# Patient Record
Sex: Male | Born: 1950 | Race: White | Hispanic: No | Marital: Married | Smoking: Former smoker
Health system: Southern US, Community
[De-identification: ages and names within clinical notes are randomized; demographics above are authoritative.]

## PROBLEM LIST (undated history)

## (undated) DIAGNOSIS — M1990 Unspecified osteoarthritis, unspecified site: Secondary | ICD-10-CM

## (undated) DIAGNOSIS — E785 Hyperlipidemia, unspecified: Secondary | ICD-10-CM

## (undated) DIAGNOSIS — A048 Other specified bacterial intestinal infections: Secondary | ICD-10-CM

## (undated) DIAGNOSIS — I10 Essential (primary) hypertension: Secondary | ICD-10-CM

## (undated) DIAGNOSIS — E538 Deficiency of other specified B group vitamins: Secondary | ICD-10-CM

## (undated) DIAGNOSIS — Z87442 Personal history of urinary calculi: Secondary | ICD-10-CM

## (undated) DIAGNOSIS — U071 COVID-19: Secondary | ICD-10-CM

## (undated) DIAGNOSIS — K219 Gastro-esophageal reflux disease without esophagitis: Secondary | ICD-10-CM

## (undated) DIAGNOSIS — E079 Disorder of thyroid, unspecified: Secondary | ICD-10-CM

## (undated) DIAGNOSIS — E0590 Thyrotoxicosis, unspecified without thyrotoxic crisis or storm: Secondary | ICD-10-CM

## (undated) DIAGNOSIS — E041 Nontoxic single thyroid nodule: Secondary | ICD-10-CM

## (undated) DIAGNOSIS — N400 Enlarged prostate without lower urinary tract symptoms: Secondary | ICD-10-CM

## (undated) DIAGNOSIS — K5792 Diverticulitis of intestine, part unspecified, without perforation or abscess without bleeding: Secondary | ICD-10-CM

## (undated) HISTORY — PX: THYROIDECTOMY, PARTIAL: SHX18

## (undated) HISTORY — PX: TONSILLECTOMY: SUR1361

## (undated) HISTORY — PX: REPLACEMENT TOTAL KNEE: SUR1224

## (undated) HISTORY — PX: HERNIA REPAIR: SHX51

## (undated) HISTORY — PX: CARPAL TUNNEL RELEASE: SHX101

## (undated) HISTORY — PX: HYDROCELE EXCISION: SHX482

## (undated) HISTORY — PX: COLONOSCOPY: SHX174

---

## 2007-03-09 ENCOUNTER — Ambulatory Visit: Payer: Self-pay | Admitting: Internal Medicine

## 2012-02-14 DIAGNOSIS — E782 Mixed hyperlipidemia: Secondary | ICD-10-CM | POA: Insufficient documentation

## 2012-02-14 DIAGNOSIS — I10 Essential (primary) hypertension: Secondary | ICD-10-CM | POA: Insufficient documentation

## 2012-09-25 DIAGNOSIS — E041 Nontoxic single thyroid nodule: Secondary | ICD-10-CM | POA: Insufficient documentation

## 2012-09-25 DIAGNOSIS — E890 Postprocedural hypothyroidism: Secondary | ICD-10-CM | POA: Insufficient documentation

## 2014-06-15 DIAGNOSIS — N200 Calculus of kidney: Secondary | ICD-10-CM | POA: Insufficient documentation

## 2014-09-01 DIAGNOSIS — A048 Other specified bacterial intestinal infections: Secondary | ICD-10-CM | POA: Insufficient documentation

## 2014-11-08 DIAGNOSIS — N401 Enlarged prostate with lower urinary tract symptoms: Secondary | ICD-10-CM | POA: Insufficient documentation

## 2017-08-11 DIAGNOSIS — E538 Deficiency of other specified B group vitamins: Secondary | ICD-10-CM | POA: Insufficient documentation

## 2019-08-09 DIAGNOSIS — D369 Benign neoplasm, unspecified site: Secondary | ICD-10-CM | POA: Insufficient documentation

## 2019-10-22 DIAGNOSIS — T8859XA Other complications of anesthesia, initial encounter: Secondary | ICD-10-CM

## 2019-10-22 HISTORY — DX: Other complications of anesthesia, initial encounter: T88.59XA

## 2020-01-19 DIAGNOSIS — Z96652 Presence of left artificial knee joint: Secondary | ICD-10-CM | POA: Insufficient documentation

## 2020-06-16 DIAGNOSIS — Z96652 Presence of left artificial knee joint: Secondary | ICD-10-CM | POA: Insufficient documentation

## 2020-10-12 DIAGNOSIS — Z8616 Personal history of COVID-19: Secondary | ICD-10-CM | POA: Insufficient documentation

## 2020-10-21 DIAGNOSIS — A419 Sepsis, unspecified organism: Secondary | ICD-10-CM

## 2020-10-21 HISTORY — DX: Sepsis, unspecified organism: A41.9

## 2021-08-16 ENCOUNTER — Other Ambulatory Visit: Payer: Self-pay | Admitting: Orthopedic Surgery

## 2021-08-16 DIAGNOSIS — G8929 Other chronic pain: Secondary | ICD-10-CM

## 2021-09-05 ENCOUNTER — Encounter
Admission: RE | Admit: 2021-09-05 | Discharge: 2021-09-05 | Disposition: A | Discharge status: Home / Self Care | Payer: Medicare Other | Source: Ambulatory Visit | Attending: Orthopedic Surgery | Admitting: Orthopedic Surgery

## 2021-09-05 ENCOUNTER — Other Ambulatory Visit: Payer: Self-pay

## 2021-09-05 DIAGNOSIS — G8929 Other chronic pain: Secondary | ICD-10-CM | POA: Insufficient documentation

## 2021-09-05 DIAGNOSIS — M25562 Pain in left knee: Secondary | ICD-10-CM | POA: Diagnosis present

## 2021-09-05 MED ORDER — TECHNETIUM TC 99M MEDRONATE IV KIT
20.0000 | PACK | Freq: Once | INTRAVENOUS | Status: AC | PRN
  Administered 2021-09-05: 22.26 via INTRAVENOUS

## 2021-09-06 ENCOUNTER — Ambulatory Visit: Payer: Medicare Other

## 2021-09-11 ENCOUNTER — Other Ambulatory Visit: Payer: Self-pay

## 2021-09-11 DIAGNOSIS — N400 Enlarged prostate without lower urinary tract symptoms: Secondary | ICD-10-CM | POA: Diagnosis present

## 2021-09-11 DIAGNOSIS — Z20822 Contact with and (suspected) exposure to covid-19: Secondary | ICD-10-CM | POA: Diagnosis present

## 2021-09-11 DIAGNOSIS — E871 Hypo-osmolality and hyponatremia: Secondary | ICD-10-CM | POA: Diagnosis present

## 2021-09-11 DIAGNOSIS — M5136 Other intervertebral disc degeneration, lumbar region: Secondary | ICD-10-CM | POA: Diagnosis present

## 2021-09-11 DIAGNOSIS — Z7952 Long term (current) use of systemic steroids: Secondary | ICD-10-CM

## 2021-09-11 DIAGNOSIS — E785 Hyperlipidemia, unspecified: Secondary | ICD-10-CM | POA: Diagnosis present

## 2021-09-11 DIAGNOSIS — I10 Essential (primary) hypertension: Secondary | ICD-10-CM | POA: Diagnosis present

## 2021-09-11 DIAGNOSIS — E876 Hypokalemia: Secondary | ICD-10-CM | POA: Diagnosis present

## 2021-09-11 DIAGNOSIS — N179 Acute kidney failure, unspecified: Secondary | ICD-10-CM | POA: Diagnosis present

## 2021-09-11 DIAGNOSIS — E872 Acidosis, unspecified: Secondary | ICD-10-CM | POA: Diagnosis present

## 2021-09-11 DIAGNOSIS — E039 Hypothyroidism, unspecified: Secondary | ICD-10-CM | POA: Diagnosis present

## 2021-09-11 DIAGNOSIS — D696 Thrombocytopenia, unspecified: Secondary | ICD-10-CM | POA: Diagnosis present

## 2021-09-11 DIAGNOSIS — Z96652 Presence of left artificial knee joint: Secondary | ICD-10-CM | POA: Diagnosis present

## 2021-09-11 DIAGNOSIS — A4159 Other Gram-negative sepsis: Principal | ICD-10-CM | POA: Diagnosis present

## 2021-09-11 DIAGNOSIS — Z791 Long term (current) use of non-steroidal anti-inflammatories (NSAID): Secondary | ICD-10-CM

## 2021-09-11 DIAGNOSIS — B961 Klebsiella pneumoniae [K. pneumoniae] as the cause of diseases classified elsewhere: Secondary | ICD-10-CM | POA: Diagnosis present

## 2021-09-11 DIAGNOSIS — R0602 Shortness of breath: Secondary | ICD-10-CM | POA: Diagnosis not present

## 2021-09-11 DIAGNOSIS — N390 Urinary tract infection, site not specified: Secondary | ICD-10-CM | POA: Diagnosis present

## 2021-09-11 DIAGNOSIS — R0789 Other chest pain: Secondary | ICD-10-CM | POA: Diagnosis present

## 2021-09-11 DIAGNOSIS — Z79899 Other long term (current) drug therapy: Secondary | ICD-10-CM

## 2021-09-11 NOTE — ED Triage Notes (Signed)
Pt to ED from home c/o fever of 100.4 at home, body aches, chills, chest tightness, and SOB for a day.  Denies cough, denies n/v/d, denies urinary symptoms.  Pt states felt like shaking and couldn't get warm.  Pt anxious and pale in triage, rubbing chest.

## 2021-09-12 ENCOUNTER — Inpatient Hospital Stay
Admission: EM | Admit: 2021-09-12 | Discharge: 2021-09-15 | DRG: 872 | Disposition: A | Discharge status: Home Health | Payer: Medicare Other | Attending: Internal Medicine | Admitting: Internal Medicine

## 2021-09-12 ENCOUNTER — Encounter: Payer: Self-pay | Admitting: Emergency Medicine

## 2021-09-12 ENCOUNTER — Emergency Department: Payer: Medicare Other

## 2021-09-12 DIAGNOSIS — E785 Hyperlipidemia, unspecified: Secondary | ICD-10-CM | POA: Diagnosis present

## 2021-09-12 DIAGNOSIS — N179 Acute kidney failure, unspecified: Secondary | ICD-10-CM | POA: Diagnosis present

## 2021-09-12 DIAGNOSIS — Z791 Long term (current) use of non-steroidal anti-inflammatories (NSAID): Secondary | ICD-10-CM | POA: Diagnosis not present

## 2021-09-12 DIAGNOSIS — R079 Chest pain, unspecified: Secondary | ICD-10-CM | POA: Diagnosis not present

## 2021-09-12 DIAGNOSIS — R0602 Shortness of breath: Secondary | ICD-10-CM | POA: Diagnosis present

## 2021-09-12 DIAGNOSIS — N390 Urinary tract infection, site not specified: Secondary | ICD-10-CM | POA: Diagnosis present

## 2021-09-12 DIAGNOSIS — B9689 Other specified bacterial agents as the cause of diseases classified elsewhere: Secondary | ICD-10-CM | POA: Insufficient documentation

## 2021-09-12 DIAGNOSIS — D696 Thrombocytopenia, unspecified: Secondary | ICD-10-CM | POA: Diagnosis present

## 2021-09-12 DIAGNOSIS — Z96652 Presence of left artificial knee joint: Secondary | ICD-10-CM | POA: Diagnosis present

## 2021-09-12 DIAGNOSIS — R7881 Bacteremia: Secondary | ICD-10-CM | POA: Diagnosis not present

## 2021-09-12 DIAGNOSIS — M5136 Other intervertebral disc degeneration, lumbar region: Secondary | ICD-10-CM | POA: Diagnosis present

## 2021-09-12 DIAGNOSIS — A4150 Gram-negative sepsis, unspecified: Secondary | ICD-10-CM | POA: Diagnosis not present

## 2021-09-12 DIAGNOSIS — E871 Hypo-osmolality and hyponatremia: Secondary | ICD-10-CM | POA: Diagnosis present

## 2021-09-12 DIAGNOSIS — E039 Hypothyroidism, unspecified: Secondary | ICD-10-CM | POA: Diagnosis present

## 2021-09-12 DIAGNOSIS — R0789 Other chest pain: Secondary | ICD-10-CM | POA: Diagnosis present

## 2021-09-12 DIAGNOSIS — Z20822 Contact with and (suspected) exposure to covid-19: Secondary | ICD-10-CM | POA: Diagnosis present

## 2021-09-12 DIAGNOSIS — A498 Other bacterial infections of unspecified site: Secondary | ICD-10-CM | POA: Diagnosis not present

## 2021-09-12 DIAGNOSIS — A4159 Other Gram-negative sepsis: Secondary | ICD-10-CM | POA: Diagnosis present

## 2021-09-12 DIAGNOSIS — Z7952 Long term (current) use of systemic steroids: Secondary | ICD-10-CM | POA: Diagnosis not present

## 2021-09-12 DIAGNOSIS — E872 Acidosis, unspecified: Secondary | ICD-10-CM | POA: Diagnosis present

## 2021-09-12 DIAGNOSIS — B961 Klebsiella pneumoniae [K. pneumoniae] as the cause of diseases classified elsewhere: Secondary | ICD-10-CM | POA: Diagnosis present

## 2021-09-12 DIAGNOSIS — N400 Enlarged prostate without lower urinary tract symptoms: Secondary | ICD-10-CM | POA: Diagnosis present

## 2021-09-12 DIAGNOSIS — Z1612 Extended spectrum beta lactamase (ESBL) resistance: Secondary | ICD-10-CM | POA: Diagnosis not present

## 2021-09-12 DIAGNOSIS — E876 Hypokalemia: Secondary | ICD-10-CM | POA: Diagnosis present

## 2021-09-12 DIAGNOSIS — I10 Essential (primary) hypertension: Secondary | ICD-10-CM | POA: Diagnosis present

## 2021-09-12 DIAGNOSIS — R000 Tachycardia, unspecified: Secondary | ICD-10-CM

## 2021-09-12 DIAGNOSIS — Z79899 Other long term (current) drug therapy: Secondary | ICD-10-CM | POA: Diagnosis not present

## 2021-09-12 DIAGNOSIS — A419 Sepsis, unspecified organism: Secondary | ICD-10-CM

## 2021-09-12 HISTORY — DX: Disorder of thyroid, unspecified: E07.9

## 2021-09-12 HISTORY — DX: Essential (primary) hypertension: I10

## 2021-09-12 HISTORY — DX: Hyperlipidemia, unspecified: E78.5

## 2021-09-12 LAB — TROPONIN I (HIGH SENSITIVITY)
Troponin I (High Sensitivity): 12 ng/L (ref <18)
Troponin I (High Sensitivity): 16 ng/L (ref <18)
Troponin I (High Sensitivity): 10 ng/L (ref <18)
Troponin I (High Sensitivity): 8 ng/L (ref <18)

## 2021-09-12 LAB — COMPREHENSIVE METABOLIC PANEL
ALT: 32 U/L (ref 0–44)
AST: 25 U/L (ref 15–41)
Albumin: 3.6 g/dL (ref 3.5–5.0)
Alkaline Phosphatase: 77 U/L (ref 38–126)
Anion gap: 9 (ref 5–15)
BUN: 22 mg/dL (ref 8–23)
CO2: 20 mmol/L — ABNORMAL LOW (ref 22–32)
Calcium: 8.6 mg/dL — ABNORMAL LOW (ref 8.9–10.3)
Chloride: 102 mmol/L (ref 98–111)
Creatinine, Ser: 1.31 mg/dL — ABNORMAL HIGH (ref 0.61–1.24)
GFR, Estimated: 59 mL/min — ABNORMAL LOW (ref >60)
Glucose, Bld: 121 mg/dL — ABNORMAL HIGH (ref 70–99)
Potassium: 3.5 mmol/L (ref 3.5–5.1)
Sodium: 131 mmol/L — ABNORMAL LOW (ref 135–145)
Total Bilirubin: 1.1 mg/dL (ref 0.3–1.2)
Total Protein: 7.1 g/dL (ref 6.5–8.1)

## 2021-09-12 LAB — PROCALCITONIN: Procalcitonin: 0.34 ng/mL

## 2021-09-12 LAB — CBC WITH DIFFERENTIAL/PLATELET
Abs Immature Granulocytes: 0.04 10*3/uL (ref 0.00–0.07)
Basophils Absolute: 0 10*3/uL (ref 0.0–0.1)
Basophils Relative: 0 %
Eosinophils Absolute: 0 10*3/uL (ref 0.0–0.5)
Eosinophils Relative: 0 %
HCT: 40 % (ref 39.0–52.0)
Hemoglobin: 14.3 g/dL (ref 13.0–17.0)
Immature Granulocytes: 1 %
Lymphocytes Relative: 12 %
Lymphs Abs: 0.8 10*3/uL (ref 0.7–4.0)
MCH: 31 pg (ref 26.0–34.0)
MCHC: 35.8 g/dL (ref 30.0–36.0)
MCV: 86.6 fL (ref 80.0–100.0)
Monocytes Absolute: 0.1 10*3/uL (ref 0.1–1.0)
Monocytes Relative: 1 %
Neutro Abs: 5.4 10*3/uL (ref 1.7–7.7)
Neutrophils Relative %: 86 %
Platelets: 155 10*3/uL (ref 150–400)
RBC: 4.62 MIL/uL (ref 4.22–5.81)
RDW: 12.2 % (ref 11.5–15.5)
WBC: 6.3 10*3/uL (ref 4.0–10.5)
nRBC: 0 % (ref 0.0–0.2)

## 2021-09-12 LAB — RESP PANEL BY RT-PCR (FLU A&B, COVID) ARPGX2
Influenza A by PCR: NEGATIVE
Influenza B by PCR: NEGATIVE
SARS Coronavirus 2 by RT PCR: NEGATIVE

## 2021-09-12 LAB — BLOOD CULTURE ID PANEL (REFLEXED) - BCID2

## 2021-09-12 LAB — URINALYSIS, ROUTINE W REFLEX MICROSCOPIC
Bilirubin Urine: NEGATIVE
Glucose, UA: NEGATIVE mg/dL
Ketones, ur: NEGATIVE mg/dL
Nitrite: NEGATIVE
Protein, ur: NEGATIVE mg/dL
Specific Gravity, Urine: 1.009 (ref 1.005–1.030)
pH: 5 (ref 5.0–8.0)

## 2021-09-12 LAB — LACTIC ACID, PLASMA
Lactic Acid, Venous: 2.9 mmol/L (ref 0.5–1.9)
Lactic Acid, Venous: 2.1 mmol/L (ref 0.5–1.9)
Lactic Acid, Venous: 1.8 mmol/L (ref 0.5–1.9)
Lactic Acid, Venous: 1.9 mmol/L (ref 0.5–1.9)

## 2021-09-12 LAB — HIV ANTIBODY (ROUTINE TESTING W REFLEX): HIV Screen 4th Generation wRfx: NONREACTIVE

## 2021-09-12 LAB — T4, FREE: Free T4: 0.77 ng/dL (ref 0.61–1.12)

## 2021-09-12 LAB — TSH: TSH: 0.989 u[IU]/mL (ref 0.350–4.500)

## 2021-09-12 LAB — LIPASE, BLOOD: Lipase: 53 U/L — ABNORMAL HIGH (ref 11–51)

## 2021-09-12 LAB — BRAIN NATRIURETIC PEPTIDE: B Natriuretic Peptide: 17.6 pg/mL (ref 0.0–100.0)

## 2021-09-12 MED ORDER — SODIUM CHLORIDE 0.9 % IV BOLUS (SEPSIS)
250.0000 mL | Freq: Once | INTRAVENOUS | Status: AC
  Administered 2021-09-12: 250 mL via INTRAVENOUS

## 2021-09-12 MED ORDER — ACETAMINOPHEN 650 MG RE SUPP
650.0000 mg | Freq: Four times a day (QID) | RECTAL | Status: DC | PRN
  Filled 2021-09-12: qty 1

## 2021-09-12 MED ORDER — ACETAMINOPHEN 325 MG PO TABS
650.0000 mg | ORAL_TABLET | Freq: Four times a day (QID) | ORAL | Status: DC | PRN
  Administered 2021-09-12 – 2021-09-15 (×6): 650 mg via ORAL
  Filled 2021-09-12 (×6): qty 2

## 2021-09-12 MED ORDER — VANCOMYCIN HCL 1500 MG/300ML IV SOLN
1500.0000 mg | Freq: Once | INTRAVENOUS | Status: AC
  Administered 2021-09-12: 1500 mg via INTRAVENOUS
  Filled 2021-09-12: qty 300

## 2021-09-12 MED ORDER — TRAZODONE HCL 50 MG PO TABS
25.0000 mg | ORAL_TABLET | Freq: Every evening | ORAL | Status: DC | PRN
  Administered 2021-09-12: 25 mg via ORAL
  Filled 2021-09-12: qty 1

## 2021-09-12 MED ORDER — ROSUVASTATIN CALCIUM 10 MG PO TABS
20.0000 mg | ORAL_TABLET | Freq: Every day | ORAL | Status: DC
  Administered 2021-09-13 – 2021-09-14 (×2): 20 mg via ORAL
  Filled 2021-09-12: qty 2
  Filled 2021-09-12 (×2): qty 1

## 2021-09-12 MED ORDER — MORPHINE SULFATE (PF) 2 MG/ML IV SOLN
2.0000 mg | INTRAVENOUS | Status: DC | PRN
  Administered 2021-09-12: 2 mg via INTRAVENOUS
  Filled 2021-09-12: qty 1

## 2021-09-12 MED ORDER — FINASTERIDE 5 MG PO TABS
5.0000 mg | ORAL_TABLET | Freq: Every day | ORAL | Status: DC
  Administered 2021-09-12 – 2021-09-15 (×4): 5 mg via ORAL
  Filled 2021-09-12 (×5): qty 1

## 2021-09-12 MED ORDER — DIAZEPAM 5 MG PO TABS
5.0000 mg | ORAL_TABLET | Freq: Every evening | ORAL | Status: DC | PRN
  Administered 2021-09-13 – 2021-09-14 (×2): 5 mg via ORAL
  Filled 2021-09-12 (×2): qty 1

## 2021-09-12 MED ORDER — ENOXAPARIN SODIUM 40 MG/0.4ML IJ SOSY
40.0000 mg | PREFILLED_SYRINGE | INTRAMUSCULAR | Status: DC
  Administered 2021-09-12 – 2021-09-14 (×3): 40 mg via SUBCUTANEOUS
  Filled 2021-09-12 (×3): qty 0.4

## 2021-09-12 MED ORDER — ONDANSETRON HCL 4 MG/2ML IJ SOLN: 4.0000 mg | Freq: Four times a day (QID) | INTRAMUSCULAR | Status: DC | PRN

## 2021-09-12 MED ORDER — TAMSULOSIN HCL 0.4 MG PO CAPS
0.4000 mg | ORAL_CAPSULE | Freq: Every day | ORAL | Status: DC
  Administered 2021-09-12 – 2021-09-15 (×4): 0.4 mg via ORAL
  Filled 2021-09-12 (×4): qty 1

## 2021-09-12 MED ORDER — MAGNESIUM HYDROXIDE 400 MG/5ML PO SUSP: 30.0000 mL | Freq: Every day | ORAL | Status: DC | PRN

## 2021-09-12 MED ORDER — SODIUM CHLORIDE 0.9 % IV SOLN
1.0000 g | Freq: Two times a day (BID) | INTRAVENOUS | Status: DC
  Administered 2021-09-12 – 2021-09-13 (×3): 1 g via INTRAVENOUS
  Filled 2021-09-12 (×3): qty 1

## 2021-09-12 MED ORDER — METRONIDAZOLE 500 MG/100ML IV SOLN
500.0000 mg | Freq: Once | INTRAVENOUS | Status: AC
  Administered 2021-09-12: 500 mg via INTRAVENOUS
  Filled 2021-09-12: qty 100

## 2021-09-12 MED ORDER — IOHEXOL 350 MG/ML SOLN
75.0000 mL | Freq: Once | INTRAVENOUS | Status: AC | PRN
  Administered 2021-09-12: 75 mL via INTRAVENOUS

## 2021-09-12 MED ORDER — SODIUM CHLORIDE 0.9 % IV BOLUS
1000.0000 mL | Freq: Once | INTRAVENOUS | Status: AC
  Administered 2021-09-12: 1000 mL via INTRAVENOUS

## 2021-09-12 MED ORDER — SODIUM CHLORIDE 0.9 % IV BOLUS (SEPSIS)
1000.0000 mL | Freq: Once | INTRAVENOUS | Status: AC
  Administered 2021-09-12: 1000 mL via INTRAVENOUS

## 2021-09-12 MED ORDER — ATENOLOL 50 MG PO TABS
50.0000 mg | ORAL_TABLET | Freq: Every day | ORAL | Status: DC
  Administered 2021-09-13 – 2021-09-15 (×3): 50 mg via ORAL
  Filled 2021-09-12: qty 2
  Filled 2021-09-12: qty 1
  Filled 2021-09-12: qty 2
  Filled 2021-09-12: qty 1

## 2021-09-12 MED ORDER — VANCOMYCIN HCL IN DEXTROSE 1-5 GM/200ML-% IV SOLN: 1000.0000 mg | Freq: Once | INTRAVENOUS | Status: DC

## 2021-09-12 MED ORDER — SODIUM CHLORIDE 0.9 % IV SOLN: INTRAVENOUS | Status: DC

## 2021-09-12 MED ORDER — ASPIRIN EC 81 MG PO TBEC
81.0000 mg | DELAYED_RELEASE_TABLET | Freq: Every day | ORAL | Status: DC
  Administered 2021-09-12 – 2021-09-15 (×4): 81 mg via ORAL
  Filled 2021-09-12 (×4): qty 1

## 2021-09-12 MED ORDER — SODIUM CHLORIDE 0.9 % IV SOLN
2.0000 g | Freq: Once | INTRAVENOUS | Status: AC
  Administered 2021-09-12: 2 g via INTRAVENOUS
  Filled 2021-09-12 (×2): qty 2

## 2021-09-12 MED ORDER — GABAPENTIN 300 MG PO CAPS
300.0000 mg | ORAL_CAPSULE | Freq: Every day | ORAL | Status: DC
  Administered 2021-09-12 – 2021-09-15 (×4): 300 mg via ORAL
  Filled 2021-09-12 (×4): qty 1

## 2021-09-12 MED ORDER — NITROGLYCERIN 0.4 MG SL SUBL: 0.4000 mg | SUBLINGUAL_TABLET | SUBLINGUAL | Status: DC | PRN

## 2021-09-12 MED ORDER — ONDANSETRON HCL 4 MG PO TABS: 4.0000 mg | ORAL_TABLET | Freq: Four times a day (QID) | ORAL | Status: DC | PRN

## 2021-09-12 MED ORDER — SODIUM CHLORIDE 0.9 % IV BOLUS (SEPSIS): 1000.0000 mL | Freq: Once | INTRAVENOUS | Status: DC

## 2021-09-12 MED ORDER — SODIUM CHLORIDE 0.9 % IV SOLN
2.0000 g | INTRAVENOUS | Status: DC
  Administered 2021-09-12: 2 g via INTRAVENOUS
  Filled 2021-09-12: qty 20

## 2021-09-12 NOTE — Progress Notes (Signed)
PHARMACY - PHYSICIAN COMMUNICATION CRITICAL VALUE ALERT - BLOOD CULTURE IDENTIFICATION (BCID)  Ernest Hutchinson is an 70 y.o. male who presented to [facility] on 09/12/2021 with a chief complaint of Shortness of breath  Assessment:  Bacteremia likely  from complicated UTI (include suspected source if known)  Name of physician (or Provider) Contacted: Eppie Gibson  Current antibiotics: Ceftriaxone 2gm IVPB q 24 hours  Changes to prescribed antibiotics recommended:  Recommendations accepted by provider  *DC ceftriaxone and start Meropenem 1gm q 12 hours (renally adjusted)  Results for orders placed or performed during the hospital encounter of 09/12/21  Blood Culture ID Panel (Reflexed) (Collected: 09/12/2021 12:19 AM)  Result Value Ref Range   Enterococcus faecalis NOT DETECTED NOT DETECTED   Enterococcus Faecium NOT DETECTED NOT DETECTED   Listeria monocytogenes NOT DETECTED NOT DETECTED   Staphylococcus species NOT DETECTED NOT DETECTED   Staphylococcus aureus (BCID) NOT DETECTED NOT DETECTED   Staphylococcus epidermidis NOT DETECTED NOT DETECTED   Staphylococcus lugdunensis NOT DETECTED NOT DETECTED   Streptococcus species NOT DETECTED NOT DETECTED   Streptococcus agalactiae NOT DETECTED NOT DETECTED   Streptococcus pneumoniae NOT DETECTED NOT DETECTED   Streptococcus pyogenes NOT DETECTED NOT DETECTED   A.calcoaceticus-baumannii NOT DETECTED NOT DETECTED   Bacteroides fragilis NOT DETECTED NOT DETECTED   Enterobacterales DETECTED (A) NOT DETECTED   Enterobacter cloacae complex NOT DETECTED NOT DETECTED   Escherichia coli NOT DETECTED NOT DETECTED   Klebsiella aerogenes NOT DETECTED NOT DETECTED   Klebsiella oxytoca NOT DETECTED NOT DETECTED   Klebsiella pneumoniae DETECTED (A) NOT DETECTED   Proteus species NOT DETECTED NOT DETECTED   Salmonella species NOT DETECTED NOT DETECTED   Serratia marcescens NOT DETECTED NOT DETECTED   Haemophilus influenzae NOT DETECTED NOT  DETECTED   Neisseria meningitidis NOT DETECTED NOT DETECTED   Pseudomonas aeruginosa NOT DETECTED NOT DETECTED   Stenotrophomonas maltophilia NOT DETECTED NOT DETECTED   Candida albicans NOT DETECTED NOT DETECTED   Candida auris NOT DETECTED NOT DETECTED   Candida glabrata NOT DETECTED NOT DETECTED   Candida krusei NOT DETECTED NOT DETECTED   Candida parapsilosis NOT DETECTED NOT DETECTED   Candida tropicalis NOT DETECTED NOT DETECTED   Cryptococcus neoformans/gattii NOT DETECTED NOT DETECTED   CTX-M ESBL DETECTED (A) NOT DETECTED   Carbapenem resistance IMP NOT DETECTED NOT DETECTED   Carbapenem resistance KPC NOT DETECTED NOT DETECTED   Carbapenem resistance NDM NOT DETECTED NOT DETECTED   Carbapenem resist OXA 48 LIKE NOT DETECTED NOT DETECTED   Carbapenem resistance VIM NOT DETECTED NOT DETECTED    Nechuma Boven Rodriguez-Guzman PharmD, BCPS 09/12/2021 1:57 PM

## 2021-09-12 NOTE — Progress Notes (Signed)
PHARMACY -  BRIEF ANTIBIOTIC NOTE   Pharmacy has received consult(s) for Vanc, cefepime  from an ED provider.  The patient's profile has been reviewed for ht/wt/allergies/indication/available labs.    One time order(s) placed for Vancomycin 1500 mg IV X 1 and Cefepime 2 gm IV X 1   Further antibiotics/pharmacy consults should be ordered by admitting physician if indicated.                       Thank you, Tommye Lehenbauer D 09/12/2021  1:25 AM

## 2021-09-12 NOTE — Progress Notes (Signed)
CODE SEPSIS - PHARMACY COMMUNICATION  **Broad Spectrum Antibiotics should be administered within 1 hour of Sepsis diagnosis**  Time Code Sepsis Called/Page Received: 11/23 @ 0114  Antibiotics Ordered: Vanc, cefepime, metronidazole  Time of 1st antibiotic administration: Metronidazole 500 mg IV  on 11/23 @ 0136  Additional action taken by pharmacy:   If necessary, Name of Provider/Nurse Contacted:     Jax Abdelrahman D ,PharmD Clinical Pharmacist  09/12/2021  1:47 AM

## 2021-09-12 NOTE — Progress Notes (Addendum)
PROGRESS NOTE    Ernest Hutchinson  URK:[id number] DOB: April 27, 1951 DOA: 09/12/2021 PCP: Rusty Aus, MD    Assessment & Plan:   Principal Problem:   UTI (urinary tract infection) Sepsis: met criteria w/ tachycardia, tachypnea, elevated lactic acid & UTI & bacteremia. Abxs were changed to IV merrem. Urine cx is pending  UTI: continue on IV merrem. Urine cx is pending.  Bacteremia: blood cxs growing klebsiella pneumoniae, sens pending. Likely secondary to UTI. Continue on IV merrem   Hyponatremia: continue on IVFs. Will continue to monitor   Lactic acidosis: resolved   Chest pain: w/ associated dyspnea. Troponins neg x 4. Cardio consulted. Nitro, morphine prn.  AKI: continue to hold zestril. Continue on IVFs   HLD: continue on statin   BPH: continue on home dose of flomax, proscar   HTN: continue on atenolol. Continue to hold zestril secondary to AKI    DVT prophylaxis: lovenox  Code Status: full  Family Communication: discussed pt's care w/ pt's wife at bedside and answered her questions  Disposition Plan: likely d/c back home   Level of care: Telemetry Cardiac  Status is: Inpatient  Remains inpatient appropriate because: severity of illness, requiring IV abxs for bacteremia & UTI      Consultants:  Cardio   Procedures:   Antimicrobials: merrem   Subjective: Pt c/o fatigue   Objective: Vitals:   09/12/21 1515 09/12/21 1530 09/12/21 1540 09/12/21 1545  BP:   122/62   Pulse: 80 83 80 85  Resp: _0 (!) 21  Temp:      TempSrc:      SpO2: 100% 100% 100% 100%  Weight:      Height:        Intake/Output Summary (Last 24 hours) at 09/12/2021 1655 Last data filed at 09/12/2021 1115 Gross per 24 hour  Intake 2650 ml  Output --  Net 2650 ml   Filed Weights   09/11/21 2358  Weight: 68 kg    Examination:  General exam: Appears calm and comfortable  Respiratory system: Clear to auscultation. Respiratory effort normal. Cardiovascular  system: S1 & S2 +. No rubs, gallops or clicks.  Gastrointestinal system: Abdomen is nondistended, soft and nontender. Normal bowel sounds heard. Central nervous system: Alert and oriented. Moves all extremities  Psychiatry: Judgement and insight appear normal. Flat mood and affect     Data Reviewed: I have personally reviewed following labs and imaging studies  CBC: Recent Labs  Lab 09/12/21 0019  WBC 6.3  NEUTROABS 5.4  HGB 14.3  HCT 40.0  MCV 86.6  PLT 831   Basic Metabolic Panel: Recent Labs  Lab 09/12/21 0019  NA 131*  K 3.5  CL 102  CO2 20*  GLUCOSE 121*  BUN 22  CREATININE 1.31*  CALCIUM 8.6*   GFR: Estimated Creatinine Clearance: 48.7 mL/min (A) (by C-G formula based on SCr of 1.31 mg/dL (H)). Liver Function Tests: Recent Labs  Lab 09/12/21 0019  AST 25  ALT 32  ALKPHOS 77  BILITOT 1.1  PROT 7.1  ALBUMIN 3.6   Recent Labs  Lab 09/12/21 0019  LIPASE 53*   No results for input(s): AMMONIA in the last 168 hours. Coagulation Profile: No results for input(s): INR, PROTIME in the last 168 hours. Cardiac Enzymes: No results for input(s): CKTOTAL, CKMB, CKMBINDEX, TROPONINI in the last 168 hours. BNP (last 3 results) No results for input(s): PROBNP in the last 8760 hours. HbA1C: No results for input(s): HGBA1C in the  last 72 hours. CBG: No results for input(s): GLUCAP in the last 168 hours. Lipid Profile: No results for input(s): CHOL, HDL, LDLCALC, TRIG, CHOLHDL, LDLDIRECT in the last 72 hours. Thyroid Function Tests: Recent Labs    09/12/21 0019  TSH 0.989  FREET4 0.77   Anemia Panel: No results for input(s): VITAMINB12, FOLATE, FERRITIN, TIBC, IRON, RETICCTPCT in the last 72 hours. Sepsis Labs: Recent Labs  Lab 09/12/21 0019 09/12/21 1145 09/12/21 1500  PROCALCITON 0.34  --   --   LATICACIDVEN 2.1*  2.9* 1.8 1.9    Recent Results (from the past 240 hour(s))  Culture, blood (routine x 2)     Status: None (Preliminary result)    Collection Time: 09/12/21 12:19 AM   Specimen: BLOOD RIGHT FOREARM  Result Value Ref Range Status   Specimen Description BLOOD RIGHT FOREARM  Final   Special Requests   Final    BOTTLES DRAWN AEROBIC AND ANAEROBIC Blood Culture adequate volume   Culture  Setup Time   Final    Organism ID to follow GRAM NEGATIVE RODS IN BOTH AEROBIC AND ANAEROBIC BOTTLES CRITICAL RESULT CALLED TO, READ BACK BY AND VERIFIED WITH: KISHAN PATEL @ [id number] ON 09/12/21.Marland KitchenMarland KitchenTKR Performed at [facility], [address] [place], [place] [address]    Culture GRAM NEGATIVE RODS  Final   Report Status PENDING  Incomplete  Culture, blood (routine x 2)     Status: None (Preliminary result)   Collection Time: 09/12/21 12:19 AM   Specimen: BLOOD  Result Value Ref Range Status   Specimen Description BLOOD LEFT HAND  Final   Special Requests   Final    BOTTLES DRAWN AEROBIC AND ANAEROBIC Blood Culture results may not be optimal due to an inadequate volume of blood received in culture bottles   Culture   Final    NO GROWTH < 12 HOURS Performed at [facility], [address], [place], [place] [address]    Report Status PENDING  Incomplete  Resp Panel by RT-PCR (Flu A&B, Covid) Nasopharyngeal Swab     Status: None   Collection Time: 09/12/21 12:19 AM   Specimen: Nasopharyngeal Swab; Nasopharyngeal(NP) swabs in vial transport medium  Result Value Ref Range Status   SARS Coronavirus 2 by RT PCR NEGATIVE NEGATIVE Final    Comment: (NOTE) SARS-CoV-2 target nucleic acids are NOT DETECTED.  The SARS-CoV-2 RNA is generally detectable in upper respiratory specimens during the acute phase of infection. The lowest concentration of SARS-CoV-2 viral copies this assay can detect is 138 copies/mL. A negative result does not preclude SARS-Cov-2 infection and should not be used as the sole basis for treatment or other patient management decisions. A negative result may occur with  improper specimen  collection/handling, submission of specimen other than nasopharyngeal swab, presence of viral mutation(s) within the areas targeted by this assay, and inadequate number of viral copies(<138 copies/mL). A negative result must be combined with clinical observations, patient history, and epidemiological information. The expected result is Negative.  Fact Sheet for Patients:  EntrepreneurPulse.com.au  Fact Sheet for Healthcare Providers:  IncredibleEmployment.be  This test is no t yet approved or cleared by the [place] FDA and  has been authorized for detection and/or diagnosis of SARS-CoV-2 by FDA under an Emergency Use Authorization (EUA). This EUA will remain  in effect (meaning this test can be used) for the duration of the COVID-19 declaration under Section 564(b)(1) of the Act, 21 U.S.C.section 360bbb-3(b)(1), unless the authorization is terminated  or  revoked sooner.       Influenza A by PCR NEGATIVE NEGATIVE Final   Influenza B by PCR NEGATIVE NEGATIVE Final    Comment: (NOTE) The Xpert Xpress SARS-CoV-2/FLU/RSV plus assay is intended as an aid in the diagnosis of influenza from Nasopharyngeal swab specimens and should not be used as a sole basis for treatment. Nasal washings and aspirates are unacceptable for Xpert Xpress SARS-CoV-2/FLU/RSV testing.  Fact Sheet for Patients: EntrepreneurPulse.com.au  Fact Sheet for Healthcare Providers: IncredibleEmployment.be  This test is not yet approved or cleared by the [place] FDA and has been authorized for detection and/or diagnosis of SARS-CoV-2 by FDA under an Emergency Use Authorization (EUA). This EUA will remain in effect (meaning this test can be used) for the duration of the COVID-19 declaration under Section 564(b)(1) of the Act, 21 U.S.C. section 360bbb-3(b)(1), unless the authorization is terminated or revoked.  Performed at [facility], [address] [place], [place] [address]   Blood Culture ID Panel (Reflexed)     Status: Abnormal   Collection Time: 09/12/21 12:19 AM  Result Value Ref Range Status   Enterococcus faecalis NOT DETECTED NOT DETECTED Final   Enterococcus Faecium NOT DETECTED NOT DETECTED Final   Listeria monocytogenes NOT DETECTED NOT DETECTED Final   Staphylococcus species NOT DETECTED NOT DETECTED Final   Staphylococcus aureus (BCID) NOT DETECTED NOT DETECTED Final   Staphylococcus epidermidis NOT DETECTED NOT DETECTED Final   Staphylococcus lugdunensis NOT DETECTED NOT DETECTED Final   Streptococcus species NOT DETECTED NOT DETECTED Final   Streptococcus agalactiae NOT DETECTED NOT DETECTED Final   Streptococcus pneumoniae NOT DETECTED NOT DETECTED Final   Streptococcus pyogenes NOT DETECTED NOT DETECTED Final   A.calcoaceticus-baumannii NOT DETECTED NOT DETECTED Final   Bacteroides fragilis NOT DETECTED NOT DETECTED Final   Enterobacterales DETECTED (A) NOT DETECTED Final    Comment: Enterobacterales represent a large order of gram negative bacteria, not a single organism. CRITICAL RESULT CALLED TO, READ BACK BY AND VERIFIED WITH: KISHAN PATEL @ 1338 ON 09/12/21.Marland KitchenMarland KitchenTKR    Enterobacter cloacae complex NOT DETECTED NOT DETECTED Final   Escherichia coli NOT DETECTED NOT DETECTED Final   Klebsiella aerogenes NOT DETECTED NOT DETECTED Final   Klebsiella oxytoca NOT DETECTED NOT DETECTED Final   Klebsiella pneumoniae DETECTED (A) NOT DETECTED Final    Comment: CRITICAL RESULT CALLED TO, READ BACK BY AND VERIFIED WITH: KISHAN PATEL @ 3762 ON 09/12/21.Marland KitchenMarland KitchenTKR    Proteus species NOT DETECTED NOT DETECTED Final   Salmonella species NOT DETECTED NOT DETECTED Final   Serratia marcescens NOT DETECTED NOT DETECTED Final   Haemophilus influenzae NOT DETECTED NOT DETECTED Final   Neisseria meningitidis NOT DETECTED NOT DETECTED Final   Pseudomonas aeruginosa NOT DETECTED NOT DETECTED Final    Stenotrophomonas maltophilia NOT DETECTED NOT DETECTED Final   Candida albicans NOT DETECTED NOT DETECTED Final   Candida auris NOT DETECTED NOT DETECTED Final   Candida glabrata NOT DETECTED NOT DETECTED Final   Candida krusei NOT DETECTED NOT DETECTED Final   Candida parapsilosis NOT DETECTED NOT DETECTED Final   Candida tropicalis NOT DETECTED NOT DETECTED Final   Cryptococcus neoformans/gattii NOT DETECTED NOT DETECTED Final   CTX-M ESBL DETECTED (A) NOT DETECTED Final    Comment: CRITICAL RESULT CALLED TO, READ BACK BY AND VERIFIED WITH: KISHAN PATEL @ 8315 ON 09/12/21.Marland KitchenMarland KitchenTKR (NOTE) Extended spectrum beta-lactamase detected. Recommend a carbapenem as initial therapy.      Carbapenem resistance IMP NOT DETECTED NOT DETECTED Final  Carbapenem resistance KPC NOT DETECTED NOT DETECTED Final   Carbapenem resistance NDM NOT DETECTED NOT DETECTED Final   Carbapenem resist OXA 48 LIKE NOT DETECTED NOT DETECTED Final   Carbapenem resistance VIM NOT DETECTED NOT DETECTED Final    Comment: Performed at [facility], [address] [place], [place] [address]         Radiology Studies: CT Angio Chest PE W/Cm &/Or Wo Cm  Result Date: 09/12/2021 CLINICAL DATA:  Concern for pulmonary embolism. EXAM: CT ANGIOGRAPHY CHEST WITH CONTRAST TECHNIQUE: Multidetector CT imaging of the chest was performed using the standard protocol during bolus administration of intravenous contrast. Multiplanar CT image reconstructions and MIPs were obtained to evaluate the vascular anatomy. CONTRAST:  66m OMNIPAQUE IOHEXOL 350 MG/ML SOLN COMPARISON:  Chest radiograph dated 09/12/2021. FINDINGS: Cardiovascular: There is no cardiomegaly or pericardial effusion. Coronary vascular calcification. Mild atherosclerotic calcification of the thoracic aorta. No aneurysmal dilatation or dissection. The origins of the great vessels of the aortic arch appear patent as visualized. Evaluation of the pulmonary arteries is  limited due to respiratory motion artifact and suboptimal visualization of the peripheral branches. No pulmonary artery embolus identified. Mediastinum/Nodes: There is no hilar or mediastinal adenopathy. The esophagus is grossly unremarkable. Status post prior right hemithyroidectomy. The left thyroid gland is heterogeneous with multiple small ill-defined hypodense nodules which appear to measure up to 15 mm. Recommend thyroid [place](ref: J Am Coll Radiol. 2015 Feb;12(2): 143-50).No mediastinal fluid collection. Lungs/Pleura: No focal consolidation, pleural effusion, pneumothorax. The central airways are patent. Upper Abdomen: No acute abnormality. Musculoskeletal: No acute osseous pathology. Review of the MIP images confirms the above findings. IMPRESSION: 1. No acute intrathoracic pathology. No CT evidence of pulmonary embolism. 2. Heterogeneous left thyroid gland with multiple nodules. Further evaluation with ultrasound on a nonemergent/outpatient basis recommended. 3. Aortic Atherosclerosis (ICD10-I70.0). Electronically Signed   By: AAnner CreteM.D.   On: 09/12/2021 03:57   DG Chest Port 1 View  Result Date: 09/12/2021 CLINICAL DATA:  Shortness of breath and fevers EXAM: PORTABLE CHEST 1 VIEW COMPARISON:  None. FINDINGS: Cardiac shadow is within normal limits. Lungs are well aerated bilaterally. Minimal left basilar atelectasis is seen. No bony abnormality is noted. IMPRESSION: Minimal left basilar atelectasis. Electronically Signed   By: MInez CatalinaM.D.   On: 09/12/2021 00:31        Scheduled Meds:  aspirin EC  81 mg Oral Daily   atenolol  50 mg Oral Daily   enoxaparin (LOVENOX) injection  40 mg Subcutaneous Q24H   finasteride  5 mg Oral Daily   gabapentin  300 mg Oral Daily   rosuvastatin  20 mg Oral QHS   tamsulosin  0.4 mg Oral Daily   Continuous Infusions:  sodium chloride 100 mL/hr at 09/12/21 1049   meropenem (MERREM) IV 1 g (09/12/21 1545)     LOS: 0 days    Time spent: 35  mins     JWyvonnia Dusky MD [facility] Pager [phone]  If 7PM-7AM, please contact night-coverage 09/12/2021, 4:55 PM

## 2021-09-12 NOTE — ED Provider Notes (Signed)
[facility] Emergency Department Provider Note   ____________________________________________   Event Date/Time   First MD Initiated Contact with Patient 09/12/21 0011     (approximate)  I have reviewed the triage vital signs and the nursing notes.   HISTORY  Chief Complaint Shortness of Breath, Chills, and Fever    HPI Ernest Hutchinson is a 70 y.o. male who presents to the ED from home with a chief complaint of fever of 100.4 degrees at home, body aches, chills, chest tightness and shortness of breath x1 day.  Patient states he thought he was having a heart attack because he kept shaking and could not get warm.  Denies cough, abdominal pain, nausea, vomiting, dysuria or diarrhea.  Denies COVID exposure.     Past Medical History:  Diagnosis Date   Hyperlipemia    Hypertension    Thyroid disease     Patient Active Problem List   Diagnosis Date Noted   UTI (urinary tract infection) 09/12/2021     Past Surgical History:  Procedure Laterality Date   HERNIA REPAIR     REPLACEMENT TOTAL KNEE Left    TONSILLECTOMY      Prior to Admission medications   Medication Sig Start Date End Date Taking? Authorizing Provider  atenolol (TENORMIN) 50 MG tablet Take 50 mg by mouth daily. In morning 10/31/20  Yes [provider]  diazepam (VALIUM) 5 MG tablet Take 5 mg by mouth at bedtime as needed. 08/27/21  Yes [provider]  finasteride (PROSCAR) 5 MG tablet Take 5 mg by mouth daily. In morning 08/28/20  Yes [provider]  gabapentin (NEURONTIN) 300 MG capsule Take 300 mg by mouth daily. At bedtime 08/30/21  Yes [provider]  ibuprofen (ADVIL) 200 MG tablet Take 400 mg by mouth 2 (two) times daily as needed.   Yes [provider]  lisinopril (ZESTRIL) 5 MG tablet Take 5 mg by mouth 1 day or 1 dose. At bedtime 09/09/21  Yes [provider]  predniSONE (DELTASONE) 10 MG tablet Take 20 mg by mouth daily.  In morning 09/06/21  Yes [provider]  rosuvastatin (CRESTOR) 20 MG tablet Take 20 mg by mouth daily. At bedtime 06/12/21  Yes [provider]  tamsulosin (FLOMAX) 0.4 MG CAPS capsule Take 0.4 mg by mouth in the morning and at bedtime. 12/21/20  Yes [provider]  etodolac (LODINE) 400 MG tablet Take 400 mg by mouth 2 (two) times daily. 08/20/21   [provider]    Allergies Patient has no allergy information on record.  History reviewed. No pertinent family history.  Social History Social History   Tobacco Use   Smoking status: Never   Smokeless tobacco: Never  Substance Use Topics   Alcohol use: Never   Drug use: Never    Review of Systems  Constitutional: Positive for fever/chills and myalgias. Eyes: No visual changes. ENT: No sore throat. Cardiovascular: Positive for chest pain. Respiratory: Positive for shortness of breath. Gastrointestinal: No abdominal pain.  No nausea, no vomiting.  No diarrhea.  No constipation. Genitourinary: Negative for dysuria. Musculoskeletal: Negative for back pain. Skin: Negative for rash. Neurological: Negative for headaches, focal weakness or numbness.   ____________________________________________   PHYSICAL EXAM:  VITAL SIGNS: ED Triage Vitals  Enc Vitals Group     BP 09/12/21 0004 120/78     Pulse Rate 09/12/21 0004 (!) 142     Resp 09/12/21 0004 20     Temp  09/12/21 0004 99.7 F (37.6 C)     Temp Source 09/12/21 0004 Oral     SpO2 09/12/21 0004 94 %     Weight 09/11/21 2358 150 lb (68 kg)     Height 09/11/21 2358 5\' 6"  (1.676 m)     Head Circumference --      Peak Flow --      Pain Score 09/11/21 2357 3     Pain Loc --      Pain Edu? --      Excl. in [place]? --     Constitutional: Alert and oriented.  Ill appearing and in moderate acute distress. Eyes: Conjunctivae are normal. PERRL. EOMI. Head: Atraumatic. Nose: No congestion/rhinnorhea. Mouth/Throat: Mucous membranes are mildly  dry.   Neck: No stridor.  Supple neck without meningismus. Cardiovascular: Tachycardic rate, regular rhythm. Grossly normal heart sounds.  Good peripheral circulation. Respiratory: Normal respiratory effort.  No retractions. Lungs diminished bibasilarly. Gastrointestinal: Soft and nontender to light or deep palpation. No distention. No abdominal bruits. No CVA tenderness. Musculoskeletal: No lower extremity tenderness nor edema.  No joint effusions. Neurologic: Alert and oriented x3.  CN II to XII grossly intact normal speech and language. No gross focal neurologic deficits are appreciated.  Skin:  Skin is warm, dry and intact. No rash noted.  No petechiae. Psychiatric: Mood and affect are normal. Speech and behavior are normal.  ____________________________________________   LABS (all labs ordered are listed, but only abnormal results are displayed)  Labs Reviewed  COMPREHENSIVE METABOLIC PANEL - Abnormal; Notable for the following components:      Result Value   Sodium 131 (*)    CO2 20 (*)    Glucose, Bld 121 (*)    Creatinine, Ser 1.31 (*)    Calcium 8.6 (*)    GFR, Estimated 59 (*)    All other components within normal limits  LIPASE, BLOOD - Abnormal; Notable for the following components:   Lipase 53 (*)    All other components within normal limits  LACTIC ACID, PLASMA - Abnormal; Notable for the following components:   Lactic Acid, Venous 2.1 (*)    All other components within normal limits  LACTIC ACID, PLASMA - Abnormal; Notable for the following components:   Lactic Acid, Venous 2.9 (*)    All other components within normal limits  URINALYSIS, ROUTINE W REFLEX MICROSCOPIC - Abnormal; Notable for the following components:   Color, Urine YELLOW (*)    APPearance HAZY (*)    Hgb urine dipstick MODERATE (*)    Leukocytes,Ua TRACE (*)    Bacteria, UA FEW (*)    All other components within normal limits  RESP PANEL BY RT-PCR (FLU A&B, COVID) ARPGX2  CULTURE, BLOOD  (ROUTINE X 2)  CULTURE, BLOOD (ROUTINE X 2)  URINE CULTURE  CBC WITH DIFFERENTIAL/PLATELET  BRAIN NATRIURETIC PEPTIDE  TSH  T4, FREE  PROCALCITONIN  TROPONIN I (HIGH SENSITIVITY)  TROPONIN I (HIGH SENSITIVITY)   ____________________________________________  EKG  ED ECG REPORT I, Mannie Ohlin J, the attending physician, personally viewed and interpreted this ECG.   Date: 09/12/2021  EKG Time: 0010  Rate: 124  Rhythm: sinus tachycardia  Axis: Normal  Intervals:none  ST&T Change: Nonspecific  ____________________________________________  RADIOLOGY I, Ivann Trimarco J, personally viewed and evaluated these images (plain radiographs) as part of my medical decision making, as well as reviewing the written report by the radiologist.  ED MD interpretation: Minimal left basilar atelectasis; no PE, thyroid nodules  Official radiology report(s): CT  Angio Chest PE W/Cm &/Or Wo Cm  Result Date: 09/12/2021 CLINICAL DATA:  Concern for pulmonary embolism. EXAM: CT ANGIOGRAPHY CHEST WITH CONTRAST TECHNIQUE: Multidetector CT imaging of the chest was performed using the standard protocol during bolus administration of intravenous contrast. Multiplanar CT image reconstructions and MIPs were obtained to evaluate the vascular anatomy. CONTRAST:  30mL OMNIPAQUE IOHEXOL 350 MG/ML SOLN COMPARISON:  Chest radiograph dated 09/12/2021. FINDINGS: Cardiovascular: There is no cardiomegaly or pericardial effusion. Coronary vascular calcification. Mild atherosclerotic calcification of the thoracic aorta. No aneurysmal dilatation or dissection. The origins of the great vessels of the aortic arch appear patent as visualized. Evaluation of the pulmonary arteries is limited due to respiratory motion artifact and suboptimal visualization of the peripheral branches. No pulmonary artery embolus identified. Mediastinum/Nodes: There is no hilar or mediastinal adenopathy. The esophagus is grossly unremarkable. Status post prior  right hemithyroidectomy. The left thyroid gland is heterogeneous with multiple small ill-defined hypodense nodules which appear to measure up to 15 mm. Recommend thyroid US (ref: J Am Coll Radiol. 2015 Feb;12(2): 143-50).No mediastinal fluid collection. Lungs/Pleura: No focal consolidation, pleural effusion, pneumothorax. The central airways are patent. Upper Abdomen: No acute abnormality. Musculoskeletal: No acute osseous pathology. Review of the MIP images confirms the above findings. IMPRESSION: 1. No acute intrathoracic pathology. No CT evidence of pulmonary embolism. 2. Heterogeneous left thyroid gland with multiple nodules. Further evaluation with ultrasound on a nonemergent/outpatient basis recommended. 3. Aortic Atherosclerosis (ICD10-I70.0). Electronically Signed   By: Anner Crete M.D.   On: 09/12/2021 03:57   DG Chest Port 1 View  Result Date: 09/12/2021 CLINICAL DATA:  Shortness of breath and fevers EXAM: PORTABLE CHEST 1 VIEW COMPARISON:  None. FINDINGS: Cardiac shadow is within normal limits. Lungs are well aerated bilaterally. Minimal left basilar atelectasis is seen. No bony abnormality is noted. IMPRESSION: Minimal left basilar atelectasis. Electronically Signed   By: Inez Catalina M.D.   On: 09/12/2021 00:31    ____________________________________________   PROCEDURES  Procedure(s) performed (including Critical Care):  .1-3 Lead EKG Interpretation Performed by: Paulette Blanch, MD Authorized by: Paulette Blanch, MD     Interpretation: abnormal     ECG rate:  126   ECG rate assessment: tachycardic     Rhythm: sinus tachycardia     Ectopy: none     Conduction: normal   Comments:     Patient placed on cardiac monitor to evaluate for arrhythmias  CRITICAL CARE Performed by: Paulette Blanch   Total critical care time: 45 minutes  Critical care time was exclusive of separately billable procedures and treating other patients.  Critical care was necessary to treat or prevent  imminent or life-threatening deterioration.  Critical care was time spent personally by me on the following activities: development of treatment plan with patient and/or surrogate as well as nursing, discussions with consultants, evaluation of patient's response to treatment, examination of patient, obtaining history from patient or surrogate, ordering and performing treatments and interventions, ordering and review of laboratory studies, ordering and review of radiographic studies, pulse oximetry and re-evaluation of patient's condition.  ____________________________________________   INITIAL IMPRESSION / ASSESSMENT AND PLAN / ED COURSE  As part of my medical decision making, I reviewed the following data within the [place] History obtained from family (spouse), Nursing notes reviewed and incorporated, Labs reviewed, EKG interpreted, Old chart reviewed, Radiograph reviewed, and Notes from prior ED visits     70 year old male presenting with fever, chills, myalgias, chest pain and shortness  of breath Differential includes, but is not limited to, viral syndrome, bronchitis including COPD exacerbation, pneumonia, reactive airway disease including asthma, CHF including exacerbation with or without pulmonary/interstitial edema, pneumothorax, ACS, thoracic trauma, and pulmonary embolism.   Will obtain sepsis work-up, initiate IV fluid hydration, obtain respiratory panel.  Will reassess.  Clinical Course as of 09/12/21 3888  Wed Sep 12, 2021  0112 Elevated lactic acid noted.  ED code sepsis initiated.  Will administer broad-spectrum IV antibiotics [JS]  0137 Respiratory panel was negative.  Laboratory results demonstrate AKI, mild hyponatremia.  No clear explanation for patient's respiratory distress.  Will obtain CTA chest to evaluate for PE. [JS]    Clinical Course User Index [JS] Paulette Blanch, MD     ____________________________________________   FINAL CLINICAL  IMPRESSION(S) / ED DIAGNOSES  Final diagnoses:  Shortness of breath  Tachycardia  Sepsis, due to unspecified organism, unspecified whether acute organ dysfunction present ([place])  AKI (acute kidney injury) ([place])  Lower urinary tract infectious disease     ED Discharge Orders     None        Note:  This document was prepared using Dragon voice recognition software and may include unintentional dictation errors.    Paulette Blanch, MD 09/12/21 [phone]

## 2021-09-12 NOTE — ED Notes (Signed)
Admitting MD at bedside at this time.

## 2021-09-12 NOTE — ED Notes (Signed)
ERMD at bedside at this time 

## 2021-09-12 NOTE — ED Notes (Signed)
Seen in room this time . NSR per CM, in NAD . Family at bedside

## 2021-09-12 NOTE — Consult Note (Signed)
CARDIOLOGY CONSULT NOTE               Patient ID: Ernest Hutchinson MRN: [id number] DOB/AGE: [birth year] 70 y.o.  Admit date: 09/12/2021 Referring Physician Dr. Richardean Chimera Primary Physician Emily Filbert primary Primary Cardiologist Dr. Doretha Sou [facility] Reason for Consultation shortness of breath chest pain  HPI: Patient is a 70 year old male followed at [facility] for hypertension hyperlipidemia thyroid disease reportedly became acutely dyspneic had fever chills sweats over the last 24 hours complains of chest tightness with significant chills and rigors he was brought to the emergency room with tachycardic rate in the 140s was found to be having-year-old sepsis with significant urinary tract infection patient was treated with broad-spectrum antibiotic therapy and hydration his shortness of breath and chest pain resolved relatively quickly without any significant sequelae now feels relatively back to normal history was obtained from patient and his wife  Review of systems complete and found to be negative unless listed above     Past Medical History:  Diagnosis Date   Hyperlipemia    Hypertension    Thyroid disease     Past Surgical History:  Procedure Laterality Date   HERNIA REPAIR     REPLACEMENT TOTAL KNEE Left    TONSILLECTOMY      (Not in a hospital admission)  Social History   Socioeconomic History   Marital status: Married    Spouse name: Not on file   Number of children: Not on file   Years of education: Not on file   Highest education level: Not on file  Occupational History   Not on file  Tobacco Use   Smoking status: Never   Smokeless tobacco: Never  Substance and Sexual Activity   Alcohol use: Never   Drug use: Never   Sexual activity: Not on file  Other Topics Concern   Not on file  Social History Narrative   Not on file   Social Determinants of Health   Financial Resource Strain: Not on file  Food Insecurity: Not on file   Transportation Needs: Not on file  Physical Activity: Not on file  Stress: Not on file  Social Connections: Not on file  Intimate Partner Violence: Not on file    History reviewed. No pertinent family history.    Review of systems complete and found to be negative unless listed above      PHYSICAL EXAM  General: Well developed, well nourished, in no acute distress HEENT:  Normocephalic and atramatic Neck:  No JVD.  Lungs: Clear bilaterally to auscultation and percussion. Heart: HRRR . Normal S1 and S2 without gallops or murmurs.  Abdomen: Bowel sounds are positive, abdomen soft and non-tender  Msk:  Back normal, normal gait. Normal strength and tone for age. Extremities: No clubbing, cyanosis or edema.   Neuro: Alert and oriented X 3. Psych:  Good affect, responds appropriately  Labs:   Lab Results  Component Value Date   WBC 6.3 09/12/2021   HGB 14.3 09/12/2021   HCT 40.0 09/12/2021   MCV 86.6 09/12/2021   PLT 155 09/12/2021    Recent Labs  Lab 09/12/21 0019  NA 131*  K 3.5  CL 102  CO2 20*  BUN 22  CREATININE 1.31*  CALCIUM 8.6*  PROT 7.1  BILITOT 1.1  ALKPHOS 77  ALT 32  AST 25  GLUCOSE 121*   No results found for: CKTOTAL, CKMB, CKMBINDEX, TROPONINI No results found for: CHOL No results found for: HDL No results  found for: [place] No results found for: TRIG No results found for: CHOLHDL No results found for: LDLDIRECT    Radiology: CT Angio Chest PE W/Cm &/Or Wo Cm  Result Date: 09/12/2021 CLINICAL DATA:  Concern for pulmonary embolism. EXAM: CT ANGIOGRAPHY CHEST WITH CONTRAST TECHNIQUE: Multidetector CT imaging of the chest was performed using the standard protocol during bolus administration of intravenous contrast. Multiplanar CT image reconstructions and MIPs were obtained to evaluate the vascular anatomy. CONTRAST:  2mL OMNIPAQUE IOHEXOL 350 MG/ML SOLN COMPARISON:  Chest radiograph dated 09/12/2021. FINDINGS: Cardiovascular: There is no  cardiomegaly or pericardial effusion. Coronary vascular calcification. Mild atherosclerotic calcification of the thoracic aorta. No aneurysmal dilatation or dissection. The origins of the great vessels of the aortic arch appear patent as visualized. Evaluation of the pulmonary arteries is limited due to respiratory motion artifact and suboptimal visualization of the peripheral branches. No pulmonary artery embolus identified. Mediastinum/Nodes: There is no hilar or mediastinal adenopathy. The esophagus is grossly unremarkable. Status post prior right hemithyroidectomy. The left thyroid gland is heterogeneous with multiple small ill-defined hypodense nodules which appear to measure up to 15 mm. Recommend thyroid US (ref: J Am Coll Radiol. 2015 Feb;12(2): 143-50).No mediastinal fluid collection. Lungs/Pleura: No focal consolidation, pleural effusion, pneumothorax. The central airways are patent. Upper Abdomen: No acute abnormality. Musculoskeletal: No acute osseous pathology. Review of the MIP images confirms the above findings. IMPRESSION: 1. No acute intrathoracic pathology. No CT evidence of pulmonary embolism. 2. Heterogeneous left thyroid gland with multiple nodules. Further evaluation with ultrasound on a nonemergent/outpatient basis recommended. 3. Aortic Atherosclerosis (ICD10-I70.0). Electronically Signed   By: Anner Crete M.D.   On: 09/12/2021 03:57   NM Bone Scan 3 Phase  Result Date: 09/06/2021 CLINICAL DATA:  Left knee pain. History of total knee arthroplasty 2 years ago. EXAM: NUCLEAR MEDICINE 3-PHASE BONE SCAN TECHNIQUE: Radionuclide angiographic images, immediate static blood pool images, and 3-hour delayed static images were obtained of the after intravenous injection of radiopharmaceutical. RADIOPHARMACEUTICALS:  22.26 mCi Tc-106m MDP IV COMPARISON:  None. FINDINGS: Vascular phase: Asymmetric increased blood flow to the left knee compared to the right knee. Blood pool phase: Asymmetric  uptake around the left knee. Delayed phase: Abnormal areas of increased uptake around the femoral component more notably medially. 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 Electronically Signed   By: Marijo Sanes M.D.   On: 09/06/2021 16:19   DG Chest Port 1 View  Result Date: 09/12/2021 CLINICAL DATA:  Shortness of breath and fevers EXAM: PORTABLE CHEST 1 VIEW COMPARISON:  None. FINDINGS: Cardiac shadow is within normal limits. Lungs are well aerated bilaterally. Minimal left basilar atelectasis is seen. No bony abnormality is noted. IMPRESSION: Minimal left basilar atelectasis. Electronically Signed   By: Inez Catalina M.D.   On: 09/12/2021 00:31    EKG: Normal sinus rhythm nonspecific ST-T wave changes rate 75  ASSESSMENT AND PLAN:  Shortness of breath Hyperlipidemia Urosepsis Atypical chest pain Mild acute knee injury  Plan Chest pain shortness of breath appeared to be related to his overall illness no direct cardiac issues underlying Continue broad-spectrum antibiotic therapy for what appears to be urosepsis Tachycardia related to underlying infection Shortness of breath has since resolved was related to his overall acute illness Continue statin repeat for hyperlipidemia  No direct cardiac interventions necessary Outpatient follow-up with primary cardiologist upon discharge   Signed: Yolonda Kida MD 09/12/2021, 1:02 PM

## 2021-09-12 NOTE — H&P (Signed)
[place]   PATIENT NAME: Ernest Hutchinson    MR#:  [id number]  DATE OF BIRTH:  1951-08-07  DATE OF ADMISSION:  09/12/2021  PRIMARY CARE PHYSICIAN: Rusty Aus, MD   Patient is coming from: Home  REQUESTING/REFERRING PHYSICIAN: Lurline Hare, MD  CHIEF COMPLAINT:   Chief Complaint  Patient presents with   Shortness of Breath   Chills   Fever    HISTORY OF PRESENT ILLNESS:  Ernest Hutchinson is a 70 y.o. Caucasian male with medical history significant for hypertension, dyslipidemia and hypothyroidism, who presented to the emergency room with acute onset of dyspnea with associated fever of 100.4 at home with body aches, chills, chest tightness for the last day.  The patient stated that he thought he was having an MI as he was shaking and could not get warm.  No cough or wheezing.  No nausea or vomiting or abdominal pain.  He admitted to urinary frequency but denied any dysuria oliguria or hematuria or flank pain.  ED Course: When he came to the ER heart rate was 142 with a temperature of 99.7 and otherwise normal vital signs.  There are respiratory rate was 26 heart rate was down to 116.  Labs revealed lactic acid 2.9 later 2.1 and CMP was remarkable for hyponatremia of 131 borderline potassium of 3.5 and CO2 of 20 creatinine was 1.3 And calcium 8.6.  High-sensitivity troponin I was 12 and later 16 with BNP of 17.6.  CBC was within normal.  Influenza antigens and COVID-19 PCR came back negative.  UA showed 6-10 WBCs with few bacteria and trace leukocytes. EKG as reviewed by me : Pending. Imaging: Portable chest x-ray showed minimal left basilar atelectasis.  Chest CTA revealed no PE or acute intrathoracic pathology.  Showed heterogenous left thyroid gland with multiple nodules with recommendation for further evaluation with ultrasound.  It showed aortic atherosclerosis.  The patient was given IV cefepime, vancomycin and Flagyl in 2.25 L of IV normal saline bolus.  He will be  admitted to a cardiac telemetry bed for further evaluation and management. PAST MEDICAL HISTORY:   Past Medical History:  Diagnosis Date   Hyperlipemia    Hypertension    Thyroid disease     PAST SURGICAL HISTORY:   Past Surgical History:  Procedure Laterality Date   HERNIA REPAIR     REPLACEMENT TOTAL KNEE Left    TONSILLECTOMY      SOCIAL HISTORY:   Social History   Tobacco Use   Smoking status: Never   Smokeless tobacco: Never  Substance Use Topics   Alcohol use: Never    FAMILY HISTORY:  History reviewed. No pertinent family history.  He denies any personal familial diseases. DRUG ALLERGIES:  Not on File  REVIEW OF SYSTEMS:   ROS As per history of present illness. All pertinent systems were reviewed above. Constitutional, HEENT, cardiovascular, respiratory, GI, GU, musculoskeletal, neuro, psychiatric, endocrine, integumentary and hematologic systems were reviewed and are otherwise negative/unremarkable except for positive findings mentioned above in the HPI.   MEDICATIONS AT HOME:   Prior to Admission medications   Medication Sig Start Date End Date Taking? Authorizing Provider  atenolol (TENORMIN) 50 MG tablet Take 50 mg by mouth daily. In morning 10/31/20  Yes [provider]  diazepam (VALIUM) 5 MG tablet Take 5 mg by mouth at bedtime as needed. 08/27/21  Yes [provider]  finasteride (PROSCAR) 5 MG tablet Take 5 mg by mouth daily. In  morning 08/28/20  Yes [provider]  gabapentin (NEURONTIN) 300 MG capsule Take 300 mg by mouth daily. At bedtime 08/30/21  Yes [provider]  ibuprofen (ADVIL) 200 MG tablet Take 400 mg by mouth 2 (two) times daily as needed.   Yes [provider]  lisinopril (ZESTRIL) 5 MG tablet Take 5 mg by mouth 1 day or 1 dose. At bedtime 09/09/21  Yes [provider]  predniSONE (DELTASONE) 10 MG tablet Take 20 mg by mouth daily. In morning 09/06/21  Yes [provider]   rosuvastatin (CRESTOR) 20 MG tablet Take 20 mg by mouth daily. At bedtime 06/12/21  Yes [provider]  tamsulosin (FLOMAX) 0.4 MG CAPS capsule Take 0.4 mg by mouth in the morning and at bedtime. 12/21/20  Yes [provider]  etodolac (LODINE) 400 MG tablet Take 400 mg by mouth 2 (two) times daily. 08/20/21   [provider]      VITAL SIGNS:  Blood pressure 100/62, pulse 83, temperature 98.9 F (37.2 C), temperature source Oral, resp. rate 18, height 5\' 6"  (1.676 m), weight 68 kg, SpO2 99 %.  PHYSICAL EXAMINATION:  Physical Exam  GENERAL:  70 y.o.-year-old Caucasian male patient lying in the bed with no acute distress.  EYES: Pupils equal, round, reactive to light and accommodation. No scleral icterus. Extraocular muscles intact.  HEENT: Head atraumatic, normocephalic. Oropharynx and nasopharynx clear.  NECK:  Supple, no jugular venous distention. No thyroid enlargement, no tenderness.  LUNGS: Normal breath sounds bilaterally, no wheezing, rales,rhonchi or crepitation. No use of accessory muscles of respiration.  CARDIOVASCULAR: Regular rate and rhythm, S1, S2 normal. No murmurs, rubs, or gallops.  ABDOMEN: Soft, nondistended, nontender. Bowel sounds present. No organomegaly or mass.  EXTREMITIES: No pedal edema, cyanosis, or clubbing.  NEUROLOGIC: Cranial nerves II through XII are intact. Muscle strength 5/5 in all extremities. Sensation intact. Gait not checked.  PSYCHIATRIC: The patient is alert and oriented x 3.  Normal affect and good eye contact. SKIN: No obvious rash, lesion, or ulcer.   LABORATORY PANEL:   CBC Recent Labs  Lab 09/12/21 0019  WBC 6.3  HGB 14.3  HCT 40.0  PLT 155   ------------------------------------------------------------------------------------------------------------------  Chemistries  Recent Labs  Lab 09/12/21 0019  NA 131*  K 3.5  CL 102  CO2 20*  GLUCOSE 121*  BUN 22  CREATININE 1.31*  CALCIUM 8.6*  AST 25   ALT 32  ALKPHOS 77  BILITOT 1.1   ------------------------------------------------------------------------------------------------------------------  Cardiac Enzymes No results for input(s): TROPONINI in the last 168 hours. ------------------------------------------------------------------------------------------------------------------  RADIOLOGY:  CT Angio Chest PE W/Cm &/Or Wo Cm  Result Date: 09/12/2021 CLINICAL DATA:  Concern for pulmonary embolism. EXAM: CT ANGIOGRAPHY CHEST WITH CONTRAST TECHNIQUE: Multidetector CT imaging of the chest was performed using the standard protocol during bolus administration of intravenous contrast. Multiplanar CT image reconstructions and MIPs were obtained to evaluate the vascular anatomy. CONTRAST:  61mL OMNIPAQUE IOHEXOL 350 MG/ML SOLN COMPARISON:  Chest radiograph dated 09/12/2021. FINDINGS: Cardiovascular: There is no cardiomegaly or pericardial effusion. Coronary vascular calcification. Mild atherosclerotic calcification of the thoracic aorta. No aneurysmal dilatation or dissection. The origins of the great vessels of the aortic arch appear patent as visualized. Evaluation of the pulmonary arteries is limited due to respiratory motion artifact and suboptimal visualization of the peripheral branches. No pulmonary artery embolus identified. Mediastinum/Nodes: There is no hilar or mediastinal adenopathy. The esophagus is grossly unremarkable. Status post prior right hemithyroidectomy. The left thyroid gland  is heterogeneous with multiple small ill-defined hypodense nodules which appear to measure up to 15 mm. Recommend thyroid US (ref: J Am Coll Radiol. 2015 Feb;12(2): 143-50).No mediastinal fluid collection. Lungs/Pleura: No focal consolidation, pleural effusion, pneumothorax. The central airways are patent. Upper Abdomen: No acute abnormality. Musculoskeletal: No acute osseous pathology. Review of the MIP images confirms the above findings. IMPRESSION: 1. No  acute intrathoracic pathology. No CT evidence of pulmonary embolism. 2. Heterogeneous left thyroid gland with multiple nodules. Further evaluation with ultrasound on a nonemergent/outpatient basis recommended. 3. Aortic Atherosclerosis (ICD10-I70.0). Electronically Signed   By: Anner Crete M.D.   On: 09/12/2021 03:57   DG Chest Port 1 View  Result Date: 09/12/2021 CLINICAL DATA:  Shortness of breath and fevers EXAM: PORTABLE CHEST 1 VIEW COMPARISON:  None. FINDINGS: Cardiac shadow is within normal limits. Lungs are well aerated bilaterally. Minimal left basilar atelectasis is seen. No bony abnormality is noted. IMPRESSION: Minimal left basilar atelectasis. Electronically Signed   By: Inez Catalina M.D.   On: 09/12/2021 00:31      IMPRESSION AND PLAN:  Principal Problem:   UTI (urinary tract infection)  1.  UTI with mild sepsis as manifested by tachycardia and tachypnea. - The patient will be admitted to a cardiac telemetry bed. - We will continue antibiotic therapy with IV Rocephin. - We will continue hydration with IV normal saline. - We will follow blood and urine cultures. - We will follow lactic acid level.  2.  Chest pain with associated dyspnea. - We will follow serial troponins. - Cardiology consult to be obtained. - I notified Dr. Clayborn Bigness with the patient. -The patient be placed on aspirin as well as as needed sublingual nitroglycerin and morphine sulfate for pain.  3.  Mild acute kidney injury. - The patient will be hydrated with IV normal saline. - We will hold nephrotoxins.  4.  Dyslipidemia. - We will continue statin therapy.  5.  BPH. - We will continue Proscar and Flomax.  6.  Essential hypertension blood - We will continue Tenormin while holding of Zestril given acute kidney injury.   DVT prophylaxis: Lovenox.  Code Status: full code.  Family Communication:  The plan of care was discussed in details with the patient (and family). I answered all  questions. The patient agreed to proceed with the above mentioned plan. Further management will depend upon hospital course. Disposition Plan: Back to previous home environment Consults called: Cardiology.   All the records are reviewed and case discussed with ED provider.  Status is: Inpatient   Remains inpatient appropriate because:Ongoing diagnostic testing needed not appropriate for outpatient work up, Unsafe d/c plan, IV treatments appropriate due to intensity of illness or inability to take PO, and Inpatient level of care appropriate due to severity of illness   Dispo: The patient is from: Home              Anticipated d/c is to: Home              Patient currently is not medically stable to d/c.              Difficult to place patient: No  TOTAL TIME TAKING CARE OF THIS PATIENT: 55 minutes.     Christel Mormon M.D on 09/12/2021 at 8:17 AM  [facility]   From 7 PM-7 AM, contact night-coverage [email or web address]  CC: Primary care physician; Rusty Aus, MD

## 2021-09-12 NOTE — Progress Notes (Signed)
Sepsis tracking by eLINK 

## 2021-09-12 NOTE — ED Notes (Signed)
Lactic=2.9 c/o lab

## 2021-09-12 NOTE — ED Notes (Signed)
Pt. Up to toilet, gait steady. Pt. And spouse expressing anxiety over pt's condition. This RN reassured pt. And spouse, and encouraged them to use call button if anything happens that they are unsure about, or if they have any questions. Pt. And spouse updated on POC.

## 2021-09-12 NOTE — ED Notes (Signed)
1500 lactic acid not collected, Dr. Jimmye Norman states d/c order since pt's lactic acid is now WNL.

## 2021-09-12 NOTE — ED Notes (Signed)
Bladder scan=340ml

## 2021-09-13 DIAGNOSIS — D696 Thrombocytopenia, unspecified: Secondary | ICD-10-CM

## 2021-09-13 DIAGNOSIS — R7881 Bacteremia: Secondary | ICD-10-CM

## 2021-09-13 LAB — BASIC METABOLIC PANEL
Anion gap: 6 (ref 5–15)
BUN: 14 mg/dL (ref 8–23)
CO2: 22 mmol/L (ref 22–32)
Calcium: 7.8 mg/dL — ABNORMAL LOW (ref 8.9–10.3)
Chloride: 107 mmol/L (ref 98–111)
Creatinine, Ser: 1.22 mg/dL (ref 0.61–1.24)
GFR, Estimated: >60 mL/min (ref >60)
Glucose, Bld: 107 mg/dL — ABNORMAL HIGH (ref 70–99)
Potassium: 3.4 mmol/L — ABNORMAL LOW (ref 3.5–5.1)
Sodium: 135 mmol/L (ref 135–145)

## 2021-09-13 LAB — CBC
HCT: 34.7 % — ABNORMAL LOW (ref 39.0–52.0)
Hemoglobin: 11.9 g/dL — ABNORMAL LOW (ref 13.0–17.0)
MCH: 29.9 pg (ref 26.0–34.0)
MCHC: 34.3 g/dL (ref 30.0–36.0)
MCV: 87.2 fL (ref 80.0–100.0)
Platelets: 120 10*3/uL — ABNORMAL LOW (ref 150–400)
RBC: 3.98 MIL/uL — ABNORMAL LOW (ref 4.22–5.81)
RDW: 12.2 % (ref 11.5–15.5)
WBC: 11.5 10*3/uL — ABNORMAL HIGH (ref 4.0–10.5)
nRBC: 0 % (ref 0.0–0.2)

## 2021-09-13 LAB — PROCALCITONIN: Procalcitonin: 20.96 ng/mL

## 2021-09-13 MED ORDER — SODIUM CHLORIDE 0.9 % IV SOLN
1.0000 g | Freq: Three times a day (TID) | INTRAVENOUS | Status: AC
  Administered 2021-09-13 – 2021-09-14 (×4): 1 g via INTRAVENOUS
  Filled 2021-09-13 (×6): qty 1

## 2021-09-13 MED ORDER — POTASSIUM CHLORIDE CRYS ER 20 MEQ PO TBCR
20.0000 meq | EXTENDED_RELEASE_TABLET | Freq: Once | ORAL | Status: AC
  Administered 2021-09-13: 20 meq via ORAL
  Filled 2021-09-13: qty 1

## 2021-09-13 NOTE — Progress Notes (Signed)
Patient states he is allergic to ceftin.  After adding in allergy to profile, it gave an indication that it may be contraindicated to take meropenem.  Nurse called pharmacy and they stated since patient has already had meropenem and had no reactions they should be fine.  Patient does not have any rashes, shortness of breath at this time.

## 2021-09-13 NOTE — Progress Notes (Signed)
Pt in bed alert. C/o of no pain or discomfort. New bag of IV fluids replaced. Temp now down to 100.3. Pt has used the urinal. Will continue to monitor to end of shift.

## 2021-09-13 NOTE — Progress Notes (Signed)
[facility]    SUBJECTIVE: Patient states to be doing much better on antibiotic therapy volume resuscitation still has low-grade temp but feels somewhat improved no palpitations tachycardia or chest pain no significant cardiac issues   Vitals:   09/13/21 0220 09/13/21 0500 09/13/21 0700 09/13/21 0800  BP: 134/66  (!) 118/59 125/66  Pulse:  (!) 111 81 86  Resp:  (!) 22  17  Temp: 100.3 F (37.9 C) 98.5 F (36.9 C)    TempSrc: Oral Oral    SpO2:   97% 98%  Weight:      Height:         Intake/Output Summary (Last 24 hours) at 09/13/2021 1016 Last data filed at 09/13/2021 0533 Gross per 24 hour  Intake 2113.97 ml  Output 825 ml  Net 1288.97 ml      PHYSICAL EXAM  General: Well developed, well nourished, in no acute distress HEENT:  Normocephalic and atramatic Neck:  No JVD.  Lungs: Clear bilaterally to auscultation and percussion. Heart: HRRR . Normal S1 and S2 without gallops or murmurs.  Abdomen: Bowel sounds are positive, abdomen soft and non-tender  Msk:  Back normal, normal gait. Normal strength and tone for age. Extremities: No clubbing, cyanosis or edema.   Neuro: Alert and oriented X 3. Psych:  Good affect, responds appropriately   LABS: Basic Metabolic Panel: Recent Labs    09/12/21 0019 09/13/21 0734  NA 131* 135  K 3.5 3.4*  CL 102 107  CO2 20* 22  GLUCOSE 121* 107*  BUN 22 14  CREATININE 1.31* 1.22  CALCIUM 8.6* 7.8*   Liver Function Tests: Recent Labs    09/12/21 0019  AST 25  ALT 32  ALKPHOS 77  BILITOT 1.1  PROT 7.1  ALBUMIN 3.6   Recent Labs    09/12/21 0019  LIPASE 53*   CBC: Recent Labs    09/12/21 0019 09/13/21 0734  WBC 6.3 11.5*  NEUTROABS 5.4  --   HGB 14.3 11.9*  HCT 40.0 34.7*  MCV 86.6 87.2  PLT 155 120*   Cardiac Enzymes: No results for input(s): CKTOTAL, CKMB, CKMBINDEX, TROPONINI in the last 72 hours. BNP: Invalid input(s): POCBNP D-Dimer: No results for input(s): DDIMER in the last 72  hours. Hemoglobin A1C: No results for input(s): HGBA1C in the last 72 hours. Fasting Lipid Panel: No results for input(s): CHOL, HDL, LDLCALC, TRIG, CHOLHDL, LDLDIRECT in the last 72 hours. Thyroid Function Tests: Recent Labs    09/12/21 0019  TSH 0.989   Anemia Panel: No results for input(s): VITAMINB12, FOLATE, FERRITIN, TIBC, IRON, RETICCTPCT in the last 72 hours.  CT Angio Chest PE W/Cm &/Or Wo Cm  Result Date: 09/12/2021 CLINICAL DATA:  Concern for pulmonary embolism. EXAM: CT ANGIOGRAPHY CHEST WITH CONTRAST TECHNIQUE: Multidetector CT imaging of the chest was performed using the standard protocol during bolus administration of intravenous contrast. Multiplanar CT image reconstructions and MIPs were obtained to evaluate the vascular anatomy. CONTRAST:  38mL OMNIPAQUE IOHEXOL 350 MG/ML SOLN COMPARISON:  Chest radiograph dated 09/12/2021. FINDINGS: Cardiovascular: There is no cardiomegaly or pericardial effusion. Coronary vascular calcification. Mild atherosclerotic calcification of the thoracic aorta. No aneurysmal dilatation or dissection. The origins of the great vessels of the aortic arch appear patent as visualized. Evaluation of the pulmonary arteries is limited due to respiratory motion artifact and suboptimal visualization of the peripheral branches. No pulmonary artery embolus identified. Mediastinum/Nodes: There is no hilar or mediastinal adenopathy. The esophagus is grossly unremarkable. Status post prior  right hemithyroidectomy. The left thyroid gland is heterogeneous with multiple small ill-defined hypodense nodules which appear to measure up to 15 mm. Recommend thyroid US (ref: J Am Coll Radiol. 2015 Feb;12(2): 143-50).No mediastinal fluid collection. Lungs/Pleura: No focal consolidation, pleural effusion, pneumothorax. The central airways are patent. Upper Abdomen: No acute abnormality. Musculoskeletal: No acute osseous pathology. Review of the MIP images confirms the above  findings. IMPRESSION: 1. No acute intrathoracic pathology. No CT evidence of pulmonary embolism. 2. Heterogeneous left thyroid gland with multiple nodules. Further evaluation with ultrasound on a nonemergent/outpatient basis recommended. 3. Aortic Atherosclerosis (ICD10-I70.0). Electronically Signed   By: Anner Crete M.D.   On: 09/12/2021 03:57   DG Chest Port 1 View  Result Date: 09/12/2021 CLINICAL DATA:  Shortness of breath and fevers EXAM: PORTABLE CHEST 1 VIEW COMPARISON:  None. FINDINGS: Cardiac shadow is within normal limits. Lungs are well aerated bilaterally. Minimal left basilar atelectasis is seen. No bony abnormality is noted. IMPRESSION: Minimal left basilar atelectasis. Electronically Signed   By: Inez Catalina M.D.   On: 09/12/2021 00:31     Echo previously known preserved left ventricular function around 50%  TELEMETRY: Sinus rhythm rate of 70:  ASSESSMENT AND PLAN:  Principal Problem:   UTI (urinary tract infection) Hypertension Hyperlipidemia Hyponatremia History of bacteremia Acute renal insufficiency   Plan Continue broad-spectrum antibiotic therapy for urosepsis Agree with hydration for renal insufficiency and sepsis Continue statin therapy for hyperlipidemia Correct electrolytes No direct cardiac therapy necessary at this stage  Cardiology we will sign off Reconsult if necessary    Yolonda Kida, MD 09/13/2021 10:16 AM

## 2021-09-13 NOTE — Progress Notes (Signed)
PHARMACY NOTE:  ANTIMICROBIAL RENAL DOSAGE ADJUSTMENT  Current antimicrobial regimen includes a mismatch between antimicrobial dosage and estimated renal function.  As per policy approved by the Pharmacy & Therapeutics and Medical Executive Committees, the antimicrobial dosage will be adjusted accordingly.  Current antimicrobial dosage:  Meropenem 1g Q12 hours  Indication: ESBL Bacteremia  Renal Function:  Estimated Creatinine Clearance: 52.3 mL/min (by C-G formula based on SCr of 1.22 mg/dL).     Antimicrobial dosage has been changed to:  Meropenem 1g Q8 hours  Additional comments:   Thank you for allowing pharmacy to be a part of this patient's care.  Pearla Dubonnet, [facility] 09/13/2021 2:36 PM

## 2021-09-13 NOTE — ED Notes (Signed)
Pt eating at this time.

## 2021-09-13 NOTE — Progress Notes (Addendum)
PROGRESS NOTE    Ernest Hutchinson  JKK:[id number] DOB: 07/28/1951 DOA: 09/12/2021 PCP: Rusty Aus, MD    Assessment & Plan:   Principal Problem:   UTI (urinary tract infection) Sepsis: met criteria w/ tachycardia, tachypnea, elevated lactic acid & UTI & bacteremia. Abxs were changed to IV merrem. Urine cx is growing klebsiella pneumoniae.   UTI: continue on IV merrem. Urine cx growing klebsiella pneumoniae, sens pending.   Bacteremia: blood cxs growing klebsiella pneumoniae, sens pending. Secondary to UTI. Continue on IV merrem  Thrombocytopenia: etiology unclear, possibly secondary to sepsis. Will continue to monitor   Leukocytosis: secondary to above infections. Continue on IV abxs   Hyponatremia: resolved   Lactic acidosis: resolved   Chest pain: w/ associated dyspnea. Troponins neg x 4. ACS r/o and no further cardiac work-up indicated as per cardio.  AKI: continue to hold home dose of lisinopril. Continue on IVFs. Cr is trending down from day prior   Hypokalemia: potassium given  HLD: continue on statin   BPH: continue on proscar, flomax   HTN: continue on atenolol. Continue to hold lisinopril    DVT prophylaxis: lovenox  Code Status: full  Family Communication: discussed pt's care w/ pt's wife at bedside and answered her questions  Disposition Plan: likely d/c back home   Level of care: Telemetry Cardiac  Status is: Inpatient  Remains inpatient appropriate because: severity of illness, requiring IV abxs for bacteremia & UTI      Consultants:  Cardio   Procedures:   Antimicrobials: merrem   Subjective: Pt c/o headache   Objective: Vitals:   09/13/21 0700 09/13/21 0800 09/13/21 1000 09/13/21 1100  BP: (!) 118/59 125/66 (!) 116/58 (!) 99/55  Pulse: 81 86 87 87  Resp:  17 (!) 21 (!) 22  Temp:      TempSrc:      SpO2: 97% 98% 100% 100%  Weight:      Height:        Intake/Output Summary (Last 24 hours) at 09/13/2021 1320 Last data filed  at 09/13/2021 0533 Gross per 24 hour  Intake 2013.97 ml  Output 825 ml  Net 1188.97 ml   Filed Weights   09/11/21 2358  Weight: 68 kg    Examination:  General exam: Appears comfortable   Respiratory system: clear breath sounds b/l  Cardiovascular system: S1/S2+. No rubs or clicks   Gastrointestinal system: Abd is soft, NT, ND & hyperactive bowel sounds Central nervous system: alert and oriented. Moves all extremities  Psychiatry: judgement and insight appear poor. Flat mood and affect     Data Reviewed: I have personally reviewed following labs and imaging studies  CBC: Recent Labs  Lab 09/12/21 0019 09/13/21 0734  WBC 6.3 11.5*  NEUTROABS 5.4  --   HGB 14.3 11.9*  HCT 40.0 34.7*  MCV 86.6 87.2  PLT 155 716*   Basic Metabolic Panel: Recent Labs  Lab 09/12/21 0019 09/13/21 0734  NA 131* 135  K 3.5 3.4*  CL 102 107  CO2 20* 22  GLUCOSE 121* 107*  BUN 22 14  CREATININE 1.31* 1.22  CALCIUM 8.6* 7.8*   GFR: Estimated Creatinine Clearance: 52.3 mL/min (by C-G formula based on SCr of 1.22 mg/dL). Liver Function Tests: Recent Labs  Lab 09/12/21 0019  AST 25  ALT 32  ALKPHOS 77  BILITOT 1.1  PROT 7.1  ALBUMIN 3.6   Recent Labs  Lab 09/12/21 0019  LIPASE 53*   No results for input(s): AMMONIA in  the last 168 hours. Coagulation Profile: No results for input(s): INR, PROTIME in the last 168 hours. Cardiac Enzymes: No results for input(s): CKTOTAL, CKMB, CKMBINDEX, TROPONINI in the last 168 hours. BNP (last 3 results) No results for input(s): PROBNP in the last 8760 hours. HbA1C: No results for input(s): HGBA1C in the last 72 hours. CBG: No results for input(s): GLUCAP in the last 168 hours. Lipid Profile: No results for input(s): CHOL, HDL, LDLCALC, TRIG, CHOLHDL, LDLDIRECT in the last 72 hours. Thyroid Function Tests: Recent Labs    09/12/21 0019  TSH 0.989  FREET4 0.77   Anemia Panel: No results for input(s): VITAMINB12, FOLATE, FERRITIN,  TIBC, IRON, RETICCTPCT in the last 72 hours. Sepsis Labs: Recent Labs  Lab 09/12/21 0019 09/12/21 1145 09/12/21 1500 09/13/21 0734  PROCALCITON 0.34  --   --  20.96  LATICACIDVEN 2.1*  2.9* 1.8 1.9  --     Recent Results (from the past 240 hour(s))  Culture, blood (routine x 2)     Status: Abnormal (Preliminary result)   Collection Time: 09/12/21 12:19 AM   Specimen: BLOOD RIGHT FOREARM  Result Value Ref Range Status   Specimen Description   Final    BLOOD RIGHT FOREARM Performed at [facility], [address], [place], [place] [address]    Special Requests   Final    BOTTLES DRAWN AEROBIC AND ANAEROBIC Blood Culture adequate volume Performed at [facility], [address] [place], [place] [address]    Culture  Setup Time   Final    Organism ID to follow GRAM NEGATIVE RODS IN BOTH AEROBIC AND ANAEROBIC BOTTLES CRITICAL RESULT CALLED TO, READ BACK BY AND VERIFIED WITH: KISHAN PATEL @ 0981 ON 09/12/21.Marland KitchenMarland KitchenTKR Performed at [facility], [address], [place], [place] [address]    Culture (A)  Final    KLEBSIELLA PNEUMONIAE SUSCEPTIBILITIES TO FOLLOW Performed at [facility], [place] [name] [place], [place] [address]    Report Status PENDING  Incomplete  Culture, blood (routine x 2)     Status: None (Preliminary result)   Collection Time: 09/12/21 12:19 AM   Specimen: BLOOD  Result Value Ref Range Status   Specimen Description BLOOD LEFT HAND  Final   Special Requests   Final    BOTTLES DRAWN AEROBIC AND ANAEROBIC Blood Culture results may not be optimal due to an inadequate volume of blood received in culture bottles   Culture   Final    NO GROWTH 1 DAY Performed at [facility], [address] [place], [place] [address]    Report Status PENDING  Incomplete  Urine Culture     Status: Abnormal (Preliminary result)   Collection Time: 09/12/21 12:19 AM   Specimen: Urine, Clean Catch  Result Value Ref Range Status    Specimen Description   Final    URINE, CLEAN CATCH Performed at [facility], [address], [place], [place] [address]    Special Requests   Final    NONE Performed at [facility], [address], [place],  [address]    Culture (A)  Final    50,000 COLONIES/mL KLEBSIELLA PNEUMONIAE SUSCEPTIBILITIES TO FOLLOW Performed at [facility], [place] [address], [place],  [address]    Report Status PENDING  Incomplete  Resp Panel by RT-PCR (Flu A&B, Covid) Nasopharyngeal Swab     Status: None   Collection Time: 09/12/21 12:19 AM   Specimen: Nasopharyngeal Swab; Nasopharyngeal(NP) swabs in  vial transport medium  Result Value Ref Range Status   SARS Coronavirus 2 by RT PCR NEGATIVE NEGATIVE Final    Comment: (NOTE) SARS-CoV-2 target nucleic acids are NOT DETECTED.  The SARS-CoV-2 RNA is generally detectable in upper respiratory specimens during the acute phase of infection. The lowest concentration of SARS-CoV-2 viral copies this assay can detect is 138 copies/mL. A negative result does not preclude SARS-Cov-2 infection and should not be used as the sole basis for treatment or other patient management decisions. A negative result may occur with  improper specimen collection/handling, submission of specimen other than nasopharyngeal swab, presence of viral mutation(s) within the areas targeted by this assay, and inadequate number of viral copies(<138 copies/mL). A negative result must be combined with clinical observations, patient history, and epidemiological information. The expected result is Negative.  Fact Sheet for Patients:  EntrepreneurPulse.com.au  Fact Sheet for Healthcare Providers:  IncredibleEmployment.be  This test is no t yet approved or cleared by the [place] FDA and  has been authorized for detection and/or diagnosis of SARS-CoV-2 by FDA under an Emergency Use Authorization (EUA). This EUA  will remain  in effect (meaning this test can be used) for the duration of the COVID-19 declaration under Section 564(b)(1) of the Act, 21 U.S.C.section 360bbb-3(b)(1), unless the authorization is terminated  or revoked sooner.       Influenza A by PCR NEGATIVE NEGATIVE Final   Influenza B by PCR NEGATIVE NEGATIVE Final    Comment: (NOTE) The Xpert Xpress SARS-CoV-2/FLU/RSV plus assay is intended as an aid in the diagnosis of influenza from Nasopharyngeal swab specimens and should not be used as a sole basis for treatment. Nasal washings and aspirates are unacceptable for Xpert Xpress SARS-CoV-2/FLU/RSV testing.  Fact Sheet for Patients: EntrepreneurPulse.com.au  Fact Sheet for Healthcare Providers: IncredibleEmployment.be  This test is not yet approved or cleared by the [place] FDA and has been authorized for detection and/or diagnosis of SARS-CoV-2 by FDA under an Emergency Use Authorization (EUA). This EUA will remain in effect (meaning this test can be used) for the duration of the COVID-19 declaration under Section 564(b)(1) of the Act, 21 U.S.C. section 360bbb-3(b)(1), unless the authorization is terminated or revoked.  Performed at [facility], [address] [place], [place] [address]   Blood Culture ID Panel (Reflexed)     Status: Abnormal   Collection Time: 09/12/21 12:19 AM  Result Value Ref Range Status   Enterococcus faecalis NOT DETECTED NOT DETECTED Final   Enterococcus Faecium NOT DETECTED NOT DETECTED Final   Listeria monocytogenes NOT DETECTED NOT DETECTED Final   Staphylococcus species NOT DETECTED NOT DETECTED Final   Staphylococcus aureus (BCID) NOT DETECTED NOT DETECTED Final   Staphylococcus epidermidis NOT DETECTED NOT DETECTED Final   Staphylococcus lugdunensis NOT DETECTED NOT DETECTED Final   Streptococcus species NOT DETECTED NOT DETECTED Final   Streptococcus agalactiae NOT DETECTED NOT  DETECTED Final   Streptococcus pneumoniae NOT DETECTED NOT DETECTED Final   Streptococcus pyogenes NOT DETECTED NOT DETECTED Final   A.calcoaceticus-baumannii NOT DETECTED NOT DETECTED Final   Bacteroides fragilis NOT DETECTED NOT DETECTED Final   Enterobacterales DETECTED (A) NOT DETECTED Final    Comment: Enterobacterales represent a large order of gram negative bacteria, not a single organism. CRITICAL RESULT CALLED TO, READ BACK BY AND VERIFIED WITH: KISHAN PATEL @ 1338 ON 09/12/21.Marland KitchenMarland KitchenTKR    Enterobacter cloacae complex NOT DETECTED NOT DETECTED Final   Escherichia coli NOT DETECTED NOT DETECTED Final   Klebsiella  aerogenes NOT DETECTED NOT DETECTED Final   Klebsiella oxytoca NOT DETECTED NOT DETECTED Final   Klebsiella pneumoniae DETECTED (A) NOT DETECTED Final    Comment: CRITICAL RESULT CALLED TO, READ BACK BY AND VERIFIED WITH: KISHAN PATEL @ 1540 ON 09/12/21.Marland KitchenMarland KitchenTKR    Proteus species NOT DETECTED NOT DETECTED Final   Salmonella species NOT DETECTED NOT DETECTED Final   Serratia marcescens NOT DETECTED NOT DETECTED Final   Haemophilus influenzae NOT DETECTED NOT DETECTED Final   Neisseria meningitidis NOT DETECTED NOT DETECTED Final   Pseudomonas aeruginosa NOT DETECTED NOT DETECTED Final   Stenotrophomonas maltophilia NOT DETECTED NOT DETECTED Final   Candida albicans NOT DETECTED NOT DETECTED Final   Candida auris NOT DETECTED NOT DETECTED Final   Candida glabrata NOT DETECTED NOT DETECTED Final   Candida krusei NOT DETECTED NOT DETECTED Final   Candida parapsilosis NOT DETECTED NOT DETECTED Final   Candida tropicalis NOT DETECTED NOT DETECTED Final   Cryptococcus neoformans/gattii NOT DETECTED NOT DETECTED Final   CTX-M ESBL DETECTED (A) NOT DETECTED Final    Comment: CRITICAL RESULT CALLED TO, READ BACK BY AND VERIFIED WITH: KISHAN PATEL @ 0867 ON 09/12/21.Marland KitchenMarland KitchenTKR (NOTE) Extended spectrum beta-lactamase detected. Recommend a carbapenem as initial therapy.       Carbapenem resistance IMP NOT DETECTED NOT DETECTED Final   Carbapenem resistance KPC NOT DETECTED NOT DETECTED Final   Carbapenem resistance NDM NOT DETECTED NOT DETECTED Final   Carbapenem resist OXA 48 LIKE NOT DETECTED NOT DETECTED Final   Carbapenem resistance VIM NOT DETECTED NOT DETECTED Final    Comment: Performed at [facility], [address], [place], [place] [address]         Radiology Studies: CT Angio Chest PE W/Cm &/Or Wo Cm  Result Date: 09/12/2021 CLINICAL DATA:  Concern for pulmonary embolism. EXAM: CT ANGIOGRAPHY CHEST WITH CONTRAST TECHNIQUE: Multidetector CT imaging of the chest was performed using the standard protocol during bolus administration of intravenous contrast. Multiplanar CT image reconstructions and MIPs were obtained to evaluate the vascular anatomy. CONTRAST:  50m OMNIPAQUE IOHEXOL 350 MG/ML SOLN COMPARISON:  Chest radiograph dated 09/12/2021. FINDINGS: Cardiovascular: There is no cardiomegaly or pericardial effusion. Coronary vascular calcification. Mild atherosclerotic calcification of the thoracic aorta. No aneurysmal dilatation or dissection. The origins of the great vessels of the aortic arch appear patent as visualized. Evaluation of the pulmonary arteries is limited due to respiratory motion artifact and suboptimal visualization of the peripheral branches. No pulmonary artery embolus identified. Mediastinum/Nodes: There is no hilar or mediastinal adenopathy. The esophagus is grossly unremarkable. Status post prior right hemithyroidectomy. The left thyroid gland is heterogeneous with multiple small ill-defined hypodense nodules which appear to measure up to 15 mm. Recommend thyroid [place](ref: J Am Coll Radiol. 2015 Feb;12(2): 143-50).No mediastinal fluid collection. Lungs/Pleura: No focal consolidation, pleural effusion, pneumothorax. The central airways are patent. Upper Abdomen: No acute abnormality. Musculoskeletal: No acute osseous pathology.  Review of the MIP images confirms the above findings. IMPRESSION: 1. No acute intrathoracic pathology. No CT evidence of pulmonary embolism. 2. Heterogeneous left thyroid gland with multiple nodules. Further evaluation with ultrasound on a nonemergent/outpatient basis recommended. 3. Aortic Atherosclerosis (ICD10-I70.0). Electronically Signed   By: AAnner CreteM.D.   On: 09/12/2021 03:57   DG Chest Port 1 View  Result Date: 09/12/2021 CLINICAL DATA:  Shortness of breath and fevers EXAM: PORTABLE CHEST 1 VIEW COMPARISON:  None. FINDINGS: Cardiac shadow is within normal limits. Lungs are well aerated bilaterally. Minimal left basilar atelectasis  is seen. No bony abnormality is noted. IMPRESSION: Minimal left basilar atelectasis. Electronically Signed   By: Inez Catalina M.D.   On: 09/12/2021 00:31        Scheduled Meds:  aspirin EC  81 mg Oral Daily   atenolol  50 mg Oral Daily   enoxaparin (LOVENOX) injection  40 mg Subcutaneous Q24H   finasteride  5 mg Oral Daily   gabapentin  300 mg Oral Daily   rosuvastatin  20 mg Oral QHS   tamsulosin  0.4 mg Oral Daily   Continuous Infusions:  sodium chloride 75 mL/hr at 09/13/21 0956   meropenem (MERREM) IV Stopped (09/13/21 1038)     LOS: 1 day    Time spent: 37 mins     Wyvonnia Dusky, MD [facility] Pager [phone]  If 7PM-7AM, please contact night-coverage 09/13/2021, 1:20 PM

## 2021-09-13 NOTE — Progress Notes (Signed)
Pt states headache, pain level about 4-5 /10 on pain scale. Administered Tylenol per PRN order. Will continue to monitor.

## 2021-09-13 NOTE — ED Notes (Signed)
Legrand Como RN aware of assigned bed

## 2021-09-14 ENCOUNTER — Inpatient Hospital Stay: Payer: Medicare Other

## 2021-09-14 DIAGNOSIS — Z1612 Extended spectrum beta lactamase (ESBL) resistance: Secondary | ICD-10-CM

## 2021-09-14 DIAGNOSIS — A498 Other bacterial infections of unspecified site: Secondary | ICD-10-CM

## 2021-09-14 LAB — CBC
HCT: 33.4 % — ABNORMAL LOW (ref 39.0–52.0)
Hemoglobin: 11.8 g/dL — ABNORMAL LOW (ref 13.0–17.0)
MCH: 30.6 pg (ref 26.0–34.0)
MCHC: 35.3 g/dL (ref 30.0–36.0)
MCV: 86.8 fL (ref 80.0–100.0)
Platelets: 138 10*3/uL — ABNORMAL LOW (ref 150–400)
RBC: 3.85 MIL/uL — ABNORMAL LOW (ref 4.22–5.81)
RDW: 12 % (ref 11.5–15.5)
WBC: 7.8 10*3/uL (ref 4.0–10.5)
nRBC: 0 % (ref 0.0–0.2)

## 2021-09-14 LAB — BASIC METABOLIC PANEL
Anion gap: 4 — ABNORMAL LOW (ref 5–15)
BUN: 14 mg/dL (ref 8–23)
CO2: 22 mmol/L (ref 22–32)
Calcium: 7.6 mg/dL — ABNORMAL LOW (ref 8.9–10.3)
Chloride: 108 mmol/L (ref 98–111)
Creatinine, Ser: 1.12 mg/dL (ref 0.61–1.24)
GFR, Estimated: >60 mL/min (ref >60)
Glucose, Bld: 109 mg/dL — ABNORMAL HIGH (ref 70–99)
Potassium: 3.5 mmol/L (ref 3.5–5.1)
Sodium: 134 mmol/L — ABNORMAL LOW (ref 135–145)

## 2021-09-14 LAB — URINE CULTURE: Culture: 50000 — AB

## 2021-09-14 MED ORDER — SODIUM CHLORIDE 0.9 % IV SOLN
1.0000 g | INTRAVENOUS | Status: DC
  Administered 2021-09-15: 1000 mg via INTRAVENOUS
  Filled 2021-09-14 (×2): qty 1

## 2021-09-14 NOTE — Progress Notes (Signed)
Patient urinated 535ml bladder scan completed as per orders, post void completed and found to be 249ml.

## 2021-09-14 NOTE — Treatment Plan (Signed)
Diagnosis: ESBL klebsiella bacteremia and UTI Baseline Creatinine 1.12    Allergies  Allergen Reactions   Ceftin [Cefuroxime] Anaphylaxis   Cefuroxime Axetil Anaphylaxis   Chlorhexidine Gluconate Itching    OPAT Orders Discharge antibiotics: Ertapenem 1 gram IV every 24 hours until 09/27/21   [facility] Care Per Protocol:  Labs weekly while on IV antibiotics: _X_ CBC with differential  _X_ CMP   _X_ Please pull PIC at completion of IV antibiotics  Fax weekly labs to [facility] [phone]  Clinic Follow Up Appt: With Dr.Amira Podolak 09/25/21 11.30am   Call [phone] with any questions

## 2021-09-14 NOTE — Consult Note (Signed)
NAME: Ernest Hutchinson  DOB: 01/01/1951  MRN: [id number]  Date/Time: 09/14/2021 2:01 PM  REQUESTING PROVIDER: Dr.Williams Subjective:  REASON FOR CONSULT: Bacteremia and UTI ? Ernest Hutchinson is a 70 y.o. male with a history of  HTN, Hyperlipidemia, LT TKA, lumbar DDD presents with chills and sudden onset fever on 09/11/21- pt was out for dinner and came home and felt hot and then had chills- temp 100.4, he had sob and chest pain and thought he was having heart attack, came to the ED Vitals 120/78, Pulse 142 Temp 99.7, sats 94%. WBC 6.3, HB 14.3, PLT 155, cr 1.31 Troponins neg and ACS ruled out Blood culture sent, He was started on vanco/cefepime and flagyl. UA had WBC and he was considered to have UTI Pt denied difficulty in passing urine, no dysuria, no incontinence- he takes flomax and finasteride I am seeing the patient as blood culture has come back positive for ESBL klebisella Pt has been  on meropenem since 11/23 PT is doing better- He is worried though and asks about his prognosis Says he had left knee replacement feb 2021 and the knee has been bothering with loosening of the implant- HE likely will go for redo Had hydrocele repair Sept 2021    Past Medical History:  Diagnosis Date   Hyperlipemia    Hypertension    Thyroid disease     Past Surgical History:  Procedure Laterality Date   HERNIA REPAIR     REPLACEMENT TOTAL KNEE Left    TONSILLECTOMY      Social History   Socioeconomic History   Marital status: Married    Spouse name: Not on file   Number of children: Not on file   Years of education: Not on file   Highest education level: Not on file  Occupational History   Not on file  Tobacco Use   Smoking status: Never   Smokeless tobacco: Never  Substance and Sexual Activity   Alcohol use: Never   Drug use: Never   Sexual activity: Not on file  Other Topics Concern   Not on file  Social History Narrative   Not on file   Social Determinants of Health    Financial Resource Strain: Not on file  Food Insecurity: Not on file  Transportation Needs: Not on file  Physical Activity: Not on file  Stress: Not on file  Social Connections: Not on file  Intimate Partner Violence: Not on file    History reviewed. No pertinent family history. Allergies  Allergen Reactions   Ceftin [Cefuroxime] Anaphylaxis   Cefuroxime Axetil Anaphylaxis   Chlorhexidine Gluconate Itching   I? Current Facility-Administered Medications  Medication Dose Route Frequency Provider Last Rate Last Admin   acetaminophen (TYLENOL) tablet 650 mg  650 mg Oral Q6H PRN Mansy, Jan A, MD   650 mg at 09/14/21 8315   Or   acetaminophen (TYLENOL) suppository 650 mg  650 mg Rectal Q6H PRN Mansy, Jan A, MD       aspirin EC tablet 81 mg  81 mg Oral Daily Mansy, Jan A, MD   81 mg at 09/14/21 0832   atenolol (TENORMIN) tablet 50 mg  50 mg Oral Daily Mansy, Jan A, MD   50 mg at 09/14/21 1761   diazepam (VALIUM) tablet 5 mg  5 mg Oral QHS PRN Mansy, Jan A, MD   5 mg at 09/13/21 2220   enoxaparin (LOVENOX) injection 40 mg  40 mg Subcutaneous Q24H Mansy, Arvella Merles, MD  40 mg at 09/13/21 2221   finasteride (PROSCAR) tablet 5 mg  5 mg Oral Daily Mansy, Jan A, MD   5 mg at 09/14/21 5462   gabapentin (NEURONTIN) capsule 300 mg  300 mg Oral Daily Mansy, Jan A, MD   300 mg at 09/14/21 7035   magnesium hydroxide (MILK OF MAGNESIA) suspension 30 mL  30 mL Oral Daily PRN Mansy, Jan A, MD       meropenem (MERREM) 1 g in sodium chloride 0.9 % 100 mL IVPB  1 g Intravenous Q8H Nazari, Walid A, RPH 200 mL/hr at 09/14/21 0737 1 g at 09/14/21 0737   morphine 2 MG/ML injection 2 mg  2 mg Intravenous Q2H PRN Mansy, Jan A, MD   2 mg at 09/12/21 2217   nitroGLYCERIN (NITROSTAT) SL tablet 0.4 mg  0.4 mg Sublingual Q5 min PRN Mansy, Jan A, MD       ondansetron [facility]) tablet 4 mg  4 mg Oral Q6H PRN Mansy, Jan A, MD       Or   ondansetron [facility]) injection 4 mg  4 mg Intravenous Q6H PRN Mansy, Jan A, MD        rosuvastatin (CRESTOR) tablet 20 mg  20 mg Oral QHS Mansy, Jan A, MD   20 mg at 09/13/21 2206   tamsulosin (FLOMAX) capsule 0.4 mg  0.4 mg Oral Daily Mansy, Jan A, MD   0.4 mg at 09/14/21 0093   traZODone (DESYREL) tablet 25 mg  25 mg Oral QHS PRN Mansy, Jan A, MD   25 mg at 09/12/21 2219     Abtx:  Anti-infectives (From admission, onward)    Start     Dose/Rate Route Frequency Ordered Stop   09/13/21 2200  meropenem (MERREM) 1 g in sodium chloride 0.9 % 100 mL IVPB        1 g 200 mL/hr over 30 Minutes Intravenous Every 8 hours 09/13/21 1436     09/12/21 1415  meropenem (MERREM) 1 g in sodium chloride 0.9 % 100 mL IVPB  Status:  Discontinued        1 g 200 mL/hr over 30 Minutes Intravenous Every 12 hours 09/12/21 1404 09/13/21 1436   09/12/21 0730  cefTRIAXone (ROCEPHIN) 2 g in sodium chloride 0.9 % 100 mL IVPB  Status:  Discontinued        2 g 200 mL/hr over 30 Minutes Intravenous Every 24 hours 09/12/21 0718 09/12/21 1404   09/12/21 0130  vancomycin (VANCOREADY) IVPB 1500 mg/300 mL        1,500 mg 150 mL/hr over 120 Minutes Intravenous  Once 09/12/21 0125 09/12/21 0522   09/12/21 0115  ceFEPIme (MAXIPIME) 2 g in sodium chloride 0.9 % 100 mL IVPB        2 g 200 mL/hr over 30 Minutes Intravenous  Once 09/12/21 0113 09/12/21 0324   09/12/21 0115  metroNIDAZOLE (FLAGYL) IVPB 500 mg        500 mg 100 mL/hr over 60 Minutes Intravenous  Once 09/12/21 0113 09/12/21 0204   09/12/21 0115  vancomycin (VANCOCIN) IVPB 1000 mg/200 mL premix  Status:  Discontinued        1,000 mg 200 mL/hr over 60 Minutes Intravenous  Once 09/12/21 0113 09/12/21 0124       REVIEW OF SYSTEMS:  Const:  fever, n chills, negative weight loss Eyes: negative diplopia or visual changes, negative eye pain ENT: negative coryza, negative sore throat Resp: negative cough, hemoptysis, dyspnea Cards: negative for chest  pain, palpitations, lower extremity edema GU: negative for frequency, dysuria and hematuria GI:  Negative for abdominal pain, diarrhea, bleeding, constipation Skin: negative for rash and pruritus Heme: negative for easy bruising and gum/nose bleeding MS: back pain radiating down left leg Left knee pain Neurolo:negative for headaches, dizziness, vertigo, memory problems  Psych:  anxiety,  Endocrine: + thyroid disease Allergy/Immunology- cefuroxime allergy : Objective:  VITALS:  BP 117/70 (BP Location: Right Arm)   Pulse 65   Temp 98.4 F (36.9 C) (Oral)   Resp 18   Ht 5\' 6"  (1.676 m)   Wt 68 kg   SpO2 98%   BMI 24.21 kg/m  PHYSICAL EXAM:  General: Alert, cooperative, no distress, appears stated age.  Head: Normocephalic, without obvious abnormality, atraumatic. Eyes: Conjunctivae clear, anicteric sclerae. Pupils are equal ENT Nares normal. No drainage or sinus tenderness.hard of hearing Lips, mucosa, and tongue normal. No Thrush Neck: Supple, symmetrical, no adenopathy, thyroid: non tender no carotid bruit and no JVD. Back: No CVA tenderness. Lungs: Clear to auscultation bilaterally. No Wheezing or Rhonchi. No rales. Heart: Regular rate and rhythm, no murmur, rub or gallop. Abdomen: Soft, non-tender,not distended. Bowel sounds normal. No masses Extremities: left knee scar atraumatic, no cyanosis. No edema. No clubbing Skin: No rashes or lesions. Or bruising Lymph: Cervical, supraclavicular normal. Neurologic: Grossly non-focal Pertinent Labs Lab Results CBC    Component Value Date/Time   WBC 7.8 09/14/2021 0427   RBC 3.85 (L) 09/14/2021 0427   HGB 11.8 (L) 09/14/2021 0427   HCT 33.4 (L) 09/14/2021 0427   PLT 138 (L) 09/14/2021 0427   MCV 86.8 09/14/2021 0427   MCH 30.6 09/14/2021 0427   MCHC 35.3 09/14/2021 0427   RDW 12.0 09/14/2021 0427   LYMPHSABS 0.8 09/12/2021 0019   MONOABS 0.1 09/12/2021 0019   EOSABS 0.0 09/12/2021 0019   BASOSABS 0.0 09/12/2021 0019    CMP Latest Ref Rng & Units 09/14/2021 09/13/2021 09/12/2021  Glucose 70 - 99 mg/dL 109(H)  107(H) 121(H)  BUN 8 - 23 mg/dL 14 14 22   Creatinine 0.61 - 1.24 mg/dL 1.12 1.22 1.31(H)  Sodium 135 - 145 mmol/L 134(L) 135 131(L)  Potassium 3.5 - 5.1 mmol/L 3.5 3.4(L) 3.5  Chloride 98 - 111 mmol/L 108 107 102  CO2 22 - 32 mmol/L 22 22 20(L)  Calcium 8.9 - 10.3 mg/dL 7.6(L) 7.8(L) 8.6(L)  Total Protein 6.5 - 8.1 g/dL - - 7.1  Total Bilirubin 0.3 - 1.2 mg/dL - - 1.1  Alkaline Phos 38 - 126 U/L - - 77  AST 15 - 41 U/L - - 25  ALT 0 - 44 U/L - - 32      Microbiology: Recent Results (from the past 240 hour(s))  Culture, blood (routine x 2)     Status: Abnormal (Preliminary result)   Collection Time: 09/12/21 12:19 AM   Specimen: BLOOD RIGHT FOREARM  Result Value Ref Range Status   Specimen Description   Final    BLOOD RIGHT FOREARM Performed at [facility], [address], [place], [place] [address]    Special Requests   Final    BOTTLES DRAWN AEROBIC AND ANAEROBIC Blood Culture adequate volume Performed at [facility], [address] [place], [place] [address]    Culture  Setup Time   Final    Organism ID to follow [place] AEROBIC AND ANAEROBIC BOTTLES CRITICAL RESULT CALLED TO, READ BACK BY AND VERIFIED WITH: KISHAN PATEL @ 0240 ON 09/12/21.Marland KitchenMarland KitchenTKR  Performed at [facility], [address] [place], [place] [address]    Culture (A)  Final    KLEBSIELLA PNEUMONIAE SUSCEPTIBILITIES TO FOLLOW REPEATING Performed at [facility], [address], [place], [place] [address]    Report Status PENDING  Incomplete  Culture, blood (routine x 2)     Status: None (Preliminary result)   Collection Time: 09/12/21 12:19 AM   Specimen: BLOOD  Result Value Ref Range Status   Specimen Description   Final    BLOOD LEFT HAND Performed at [facility], [address], [place], [place] [address]    Special Requests   Final    BOTTLES DRAWN AEROBIC AND ANAEROBIC Blood Culture results may not be optimal due to an  inadequate volume of blood received in culture bottles Performed at [facility], [address], [place], [place] [address]    Culture  Setup Time   Final    ANAEROBIC BOTTLE ONLY GRAM NEGATIVE RODS CRITICAL VALUE NOTED.  VALUE IS CONSISTENT WITH PREVIOUSLY REPORTED AND CALLED VALUE.    Culture GRAM NEGATIVE RODS  Final   Report Status PENDING  Incomplete  Urine Culture     Status: Abnormal   Collection Time: 09/12/21 12:19 AM   Specimen: Urine, Clean Catch  Result Value Ref Range Status   Specimen Description   Final    URINE, CLEAN CATCH Performed at [facility], [address] [place], [place] [address]    Special Requests   Final    NONE Performed at [facility], [place], [place] [address]    Culture 50,000 COLONIES/mL KLEBSIELLA PNEUMONIAE (A)  Final   Report Status 09/14/2021 FINAL  Final   Organism ID, Bacteria KLEBSIELLA PNEUMONIAE (A)  Final      Susceptibility   Klebsiella pneumoniae - MIC*    AMPICILLIN >=32 RESISTANT Resistant     CEFAZOLIN >=64 RESISTANT Resistant     CEFEPIME >=32 RESISTANT Resistant     CEFTRIAXONE >=64 RESISTANT Resistant     CIPROFLOXACIN 2 RESISTANT Resistant     GENTAMICIN >=16 RESISTANT Resistant     IMIPENEM 0.5 SENSITIVE Sensitive     NITROFURANTOIN 128 RESISTANT Resistant     TRIMETH/SULFA >=320 RESISTANT Resistant     AMPICILLIN/SULBACTAM >=32 RESISTANT Resistant     PIP/TAZO 32 INTERMEDIATE Intermediate     * 50,000 COLONIES/mL KLEBSIELLA PNEUMONIAE  Resp Panel by RT-PCR (Flu A&B, Covid) Nasopharyngeal Swab     Status: None   Collection Time: 09/12/21 12:19 AM   Specimen: Nasopharyngeal Swab; Nasopharyngeal(NP) swabs in vial transport medium  Result Value Ref Range Status   SARS Coronavirus 2 by RT PCR NEGATIVE NEGATIVE Final    Comment: (NOTE) SARS-CoV-2 target nucleic acids are NOT DETECTED.  The SARS-CoV-2 RNA is generally detectable in upper respiratory specimens during the  acute phase of infection. The lowest concentration of SARS-CoV-2 viral copies this assay can detect is 138 copies/mL. A negative result does not preclude SARS-Cov-2 infection and should not be used as the sole basis for treatment or other patient management decisions. A negative result may occur with  improper specimen collection/handling, submission of specimen other than nasopharyngeal swab, presence of viral mutation(s) within the areas targeted by this assay, and inadequate number of viral copies(<138 copies/mL). A negative result must be combined with clinical observations, patient history, and epidemiological information. The expected result is Negative.  Fact Sheet for Patients:  EntrepreneurPulse.com.au  Fact Sheet for Healthcare Providers:  IncredibleEmployment.be  This test is no t yet approved or cleared by the [place] and  has been authorized for detection and/or diagnosis of SARS-CoV-2 by FDA under an Emergency Use Authorization (EUA). This EUA will remain  in effect (meaning this test can be used) for the duration of the COVID-19 declaration under Section 564(b)(1) of the Act, 21 U.S.C.section 360bbb-3(b)(1), unless the authorization is terminated  or revoked sooner.       Influenza A by PCR NEGATIVE NEGATIVE Final   Influenza B by PCR NEGATIVE NEGATIVE Final    Comment: (NOTE) The Xpert Xpress SARS-CoV-2/FLU/RSV plus assay is intended as an aid in the diagnosis of influenza from Nasopharyngeal swab specimens and should not be used as a sole basis for treatment. Nasal washings and aspirates are unacceptable for Xpert Xpress SARS-CoV-2/FLU/RSV testing.  Fact Sheet for Patients: EntrepreneurPulse.com.au  Fact Sheet for Healthcare Providers: IncredibleEmployment.be  This test is not yet approved or cleared by the [place] FDA and has been authorized for detection and/or  diagnosis of SARS-CoV-2 by FDA under an Emergency Use Authorization (EUA). This EUA will remain in effect (meaning this test can be used) for the duration of the COVID-19 declaration under Section 564(b)(1) of the Act, 21 U.S.C. section 360bbb-3(b)(1), unless the authorization is terminated or revoked.  Performed at [facility], [address] [place], [place] [address]   Blood Culture ID Panel (Reflexed)     Status: Abnormal   Collection Time: 09/12/21 12:19 AM  Result Value Ref Range Status   Enterococcus faecalis NOT DETECTED NOT DETECTED Final   Enterococcus Faecium NOT DETECTED NOT DETECTED Final   Listeria monocytogenes NOT DETECTED NOT DETECTED Final   Staphylococcus species NOT DETECTED NOT DETECTED Final   Staphylococcus aureus (BCID) NOT DETECTED NOT DETECTED Final   Staphylococcus epidermidis NOT DETECTED NOT DETECTED Final   Staphylococcus lugdunensis NOT DETECTED NOT DETECTED Final   Streptococcus species NOT DETECTED NOT DETECTED Final   Streptococcus agalactiae NOT DETECTED NOT DETECTED Final   Streptococcus pneumoniae NOT DETECTED NOT DETECTED Final   Streptococcus pyogenes NOT DETECTED NOT DETECTED Final   A.calcoaceticus-baumannii NOT DETECTED NOT DETECTED Final   Bacteroides fragilis NOT DETECTED NOT DETECTED Final   Enterobacterales DETECTED (A) NOT DETECTED Final    Comment: Enterobacterales represent a large order of gram negative bacteria, not a single organism. CRITICAL RESULT CALLED TO, READ BACK BY AND VERIFIED WITH: KISHAN PATEL @ 1338 ON 09/12/21.Marland KitchenMarland KitchenTKR    Enterobacter cloacae complex NOT DETECTED NOT DETECTED Final   Escherichia coli NOT DETECTED NOT DETECTED Final   Klebsiella aerogenes NOT DETECTED NOT DETECTED Final   Klebsiella oxytoca NOT DETECTED NOT DETECTED Final   Klebsiella pneumoniae DETECTED (A) NOT DETECTED Final    Comment: CRITICAL RESULT CALLED TO, READ BACK BY AND VERIFIED WITH: KISHAN PATEL @ 7893 ON 09/12/21.Marland KitchenMarland KitchenTKR     Proteus species NOT DETECTED NOT DETECTED Final   Salmonella species NOT DETECTED NOT DETECTED Final   Serratia marcescens NOT DETECTED NOT DETECTED Final   Haemophilus influenzae NOT DETECTED NOT DETECTED Final   Neisseria meningitidis NOT DETECTED NOT DETECTED Final   Pseudomonas aeruginosa NOT DETECTED NOT DETECTED Final   Stenotrophomonas maltophilia NOT DETECTED NOT DETECTED Final   Candida albicans NOT DETECTED NOT DETECTED Final   Candida auris NOT DETECTED NOT DETECTED Final   Candida glabrata NOT DETECTED NOT DETECTED Final   Candida krusei NOT DETECTED NOT DETECTED Final   Candida parapsilosis NOT DETECTED NOT DETECTED Final  Candida tropicalis NOT DETECTED NOT DETECTED Final   Cryptococcus neoformans/gattii NOT DETECTED NOT DETECTED Final   CTX-M ESBL DETECTED (A) NOT DETECTED Final    Comment: CRITICAL RESULT CALLED TO, READ BACK BY AND VERIFIED WITH: KISHAN PATEL @ 5465 ON 09/12/21.Marland KitchenMarland KitchenTKR (NOTE) Extended spectrum beta-lactamase detected. Recommend a carbapenem as initial therapy.      Carbapenem resistance IMP NOT DETECTED NOT DETECTED Final   Carbapenem resistance KPC NOT DETECTED NOT DETECTED Final   Carbapenem resistance NDM NOT DETECTED NOT DETECTED Final   Carbapenem resist OXA 48 LIKE NOT DETECTED NOT DETECTED Final   Carbapenem resistance VIM NOT DETECTED NOT DETECTED Final    Comment: Performed at [facility], [address] [place], [place] [address]    IMAGING RESULTS: CT angio- no PE I have personally reviewed the films ? 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 Htn- on atenolol  HLD on crestor? ?PICC can be placed tomorrow if today's blood culture remains negative ____OPAT orders placed  _______________________________________________ Discussed with patient,wife and  requesting provider Note:  This document was prepared using Dragon voice recognition software and may include unintentional dictation errors.

## 2021-09-14 NOTE — TOC CM/SW Note (Addendum)
Per rounds patient will likely DC home on IV antibiotics. ID consulted by MD. CSW notified [name] with [facility] of possible IV antibiotics need. TOC to follow.   3:25- Confirmed patient will need home IV antibiotics.  Carolynn Sayers at bedside to do teaching.  No HH agency preference. Referral made to [facility] with [facility] for [facility].  Oleh Genin, [place]

## 2021-09-14 NOTE — Progress Notes (Signed)
PROGRESS NOTE    Ernest Hutchinson  LKJ:[id number] DOB: 06-10-1951 DOA: 09/12/2021 PCP: Rusty Aus, MD    Assessment & Plan:   Principal Problem:   UTI (urinary tract infection) Sepsis: met criteria w/ tachycardia, tachypnea, elevated lactic acid & UTI & bacteremia. Continue on IV merrem. Urine cx is growing klebsiella pneumoniae. Sepsis resolved   UTI: continue on IV merrem. Urine cx growing klebsiella pneumoniae.  Bacteremia: blood cxs growing klebsiella pneumoniae, sens pending. Secondary to UTI. Continue on IV merrem. Repeat blood cxs ordered today. Likely will have to d/c home w/ PICC line & IV abxs. ID consulted   Thrombocytopenia: etiology unclear. Will continue to monitor   Leukocytosis: resolved   Hyponatremia: labile. Will continue to monitor   Lactic acidosis: resolved   Chest pain: w/ associated dyspnea. Troponins neg x 4. ACS r/o and no further cardiac work-up indicated as per cardio.  AKI: Cr is trending down again today. Avoid nephrotoxic meds   Hypokalemia: WNL today   HLD: continue on statin   BPH: continue on flomax, proscar   HTN: continue on atenolol. Continue to hold lisinopril    DVT prophylaxis: lovenox  Code Status: full  Family Communication:  Disposition Plan: likely d/c back home   Level of care: Telemetry Cardiac  Status is: Inpatient  Remains inpatient appropriate because: severity of illness, requiring IV abxs for bacteremia & UTI      Consultants:  Cardio   Procedures:   Antimicrobials: merrem   Subjective: Pt c/o not sleeping well   Objective: Vitals:   09/13/21 1630 09/13/21 1730 09/13/21 2055 09/14/21 0000  BP: (!) 103/48 126/64 114/62 116/68  Pulse: 73 68 75 74  Resp: '17 18 17 16  ' Temp: 98.2 F (36.8 C) 98.7 F (37.1 C) 98.1 F (36.7 C) 98.3 F (36.8 C)  TempSrc:  Oral    SpO2: 95% 99% 98% 97%  Weight:      Height:        Intake/Output Summary (Last 24 hours) at 09/14/2021 0808 Last data filed at  09/14/2021 0436 Gross per 24 hour  Intake 795 ml  Output 850 ml  Net -55 ml   Filed Weights   09/11/21 2358  Weight: 68 kg    Examination:  General exam: Appears calm & comfortable  Respiratory system: clear breath sounds b/l  Cardiovascular system: S1 & S2+. No rubs or gallops  Gastrointestinal system: Abd is soft, NT, ND & hyperactive bowel sounds  Central nervous system: alert and oriented. Moves all extremities   Psychiatry: judgement and insight appear poor. Flat mood and affect     Data Reviewed: I have personally reviewed following labs and imaging studies  CBC: Recent Labs  Lab 09/12/21 0019 09/13/21 0734 09/14/21 0427  WBC 6.3 11.5* 7.8  NEUTROABS 5.4  --   --   HGB 14.3 11.9* 11.8*  HCT 40.0 34.7* 33.4*  MCV 86.6 87.2 86.8  PLT 155 120* 794*   Basic Metabolic Panel: Recent Labs  Lab 09/12/21 0019 09/13/21 0734 09/14/21 0427  NA 131* 135 134*  K 3.5 3.4* 3.5  CL 102 107 108  CO2 20* 22 22  GLUCOSE 121* 107* 109*  BUN '22 14 14  ' CREATININE 1.31* 1.22 1.12  CALCIUM 8.6* 7.8* 7.6*   GFR: Estimated Creatinine Clearance: 57 mL/min (by C-G formula based on SCr of 1.12 mg/dL). Liver Function Tests: Recent Labs  Lab 09/12/21 0019  AST 25  ALT 32  ALKPHOS 77  BILITOT 1.1  PROT 7.1  ALBUMIN 3.6   Recent Labs  Lab 09/12/21 0019  LIPASE 53*   No results for input(s): AMMONIA in the last 168 hours. Coagulation Profile: No results for input(s): INR, PROTIME in the last 168 hours. Cardiac Enzymes: No results for input(s): CKTOTAL, CKMB, CKMBINDEX, TROPONINI in the last 168 hours. BNP (last 3 results) No results for input(s): PROBNP in the last 8760 hours. HbA1C: No results for input(s): HGBA1C in the last 72 hours. CBG: No results for input(s): GLUCAP in the last 168 hours. Lipid Profile: No results for input(s): CHOL, HDL, LDLCALC, TRIG, CHOLHDL, LDLDIRECT in the last 72 hours. Thyroid Function Tests: Recent Labs    09/12/21 0019  TSH  0.989  FREET4 0.77   Anemia Panel: No results for input(s): VITAMINB12, FOLATE, FERRITIN, TIBC, IRON, RETICCTPCT in the last 72 hours. Sepsis Labs: Recent Labs  Lab 09/12/21 0019 09/12/21 1145 09/12/21 1500 09/13/21 0734  PROCALCITON 0.34  --   --  20.96  LATICACIDVEN 2.1*  2.9* 1.8 1.9  --     Recent Results (from the past 240 hour(s))  Culture, blood (routine x 2)     Status: Abnormal (Preliminary result)   Collection Time: 09/12/21 12:19 AM   Specimen: BLOOD RIGHT FOREARM  Result Value Ref Range Status   Specimen Description   Final    BLOOD RIGHT FOREARM Performed at [facility], [address], [place], [place] [address]    Special Requests   Final    BOTTLES DRAWN AEROBIC AND ANAEROBIC Blood Culture adequate volume Performed at [facility], [address] [place], [place] [address]    Culture  Setup Time   Final    Organism ID to follow GRAM NEGATIVE RODS IN BOTH AEROBIC AND ANAEROBIC BOTTLES CRITICAL RESULT CALLED TO, READ BACK BY AND VERIFIED WITH: KISHAN PATEL @ [phone] ON 09/12/21.Marland KitchenMarland KitchenTKR Performed at [facility], [address], [place], [place] [address]    Culture (A)  Final    KLEBSIELLA PNEUMONIAE SUSCEPTIBILITIES TO FOLLOW REPEATING Performed at [facility], [place] [address], [place], [place] [address]    Report Status PENDING  Incomplete  Culture, blood (routine x 2)     Status: None (Preliminary result)   Collection Time: 09/12/21 12:19 AM   Specimen: BLOOD  Result Value Ref Range Status   Specimen Description   Final    BLOOD LEFT HAND Performed at [facility], [address] [place], [place] [address]    Special Requests   Final    BOTTLES DRAWN AEROBIC AND ANAEROBIC Blood Culture results may not be optimal due to an inadequate volume of blood received in culture bottles Performed at [facility], [address] [place], [place] [address]    Culture  Setup Time   Final    ANAEROBIC  BOTTLE ONLY GRAM NEGATIVE RODS CRITICAL VALUE NOTED.  VALUE IS CONSISTENT WITH PREVIOUSLY REPORTED AND CALLED VALUE.    Culture GRAM NEGATIVE RODS  Final   Report Status PENDING  Incomplete  Urine Culture     Status: Abnormal   Collection Time: 09/12/21 12:19 AM   Specimen: Urine, Clean Catch  Result Value Ref Range Status   Specimen Description   Final    URINE, CLEAN CATCH Performed at [facility], [address] [place], [place] [address]    Special Requests   Final    NONE Performed at [facility], [address], [place], [place] [address]  Culture 50,000 COLONIES/mL KLEBSIELLA PNEUMONIAE (A)  Final   Report Status 09/14/2021 FINAL  Final   Organism ID, Bacteria KLEBSIELLA PNEUMONIAE (A)  Final      Susceptibility   Klebsiella pneumoniae - MIC*    AMPICILLIN >=32 RESISTANT Resistant     CEFAZOLIN >=64 RESISTANT Resistant     CEFEPIME >=32 RESISTANT Resistant     CEFTRIAXONE >=64 RESISTANT Resistant     CIPROFLOXACIN 2 RESISTANT Resistant     GENTAMICIN >=16 RESISTANT Resistant     IMIPENEM 0.5 SENSITIVE Sensitive     NITROFURANTOIN 128 RESISTANT Resistant     TRIMETH/SULFA >=320 RESISTANT Resistant     AMPICILLIN/SULBACTAM >=32 RESISTANT Resistant     PIP/TAZO 32 INTERMEDIATE Intermediate     * 50,000 COLONIES/mL KLEBSIELLA PNEUMONIAE  Resp Panel by RT-PCR (Flu A&B, Covid) Nasopharyngeal Swab     Status: None   Collection Time: 09/12/21 12:19 AM   Specimen: Nasopharyngeal Swab; Nasopharyngeal(NP) swabs in vial transport medium  Result Value Ref Range Status   SARS Coronavirus 2 by RT PCR NEGATIVE NEGATIVE Final    Comment: (NOTE) SARS-CoV-2 target nucleic acids are NOT DETECTED.  The SARS-CoV-2 RNA is generally detectable in upper respiratory specimens during the acute phase of infection. The lowest concentration of SARS-CoV-2 viral copies this assay can detect is 138 copies/mL. A negative result does not preclude SARS-Cov-2 infection and  should not be used as the sole basis for treatment or other patient management decisions. A negative result may occur with  improper specimen collection/handling, submission of specimen other than nasopharyngeal swab, presence of viral mutation(s) within the areas targeted by this assay, and inadequate number of viral copies(<138 copies/mL). A negative result must be combined with clinical observations, patient history, and epidemiological information. The expected result is Negative.  Fact Sheet for Patients:  EntrepreneurPulse.com.au  Fact Sheet for Healthcare Providers:  IncredibleEmployment.be  This test is no t yet approved or cleared by the [place] FDA and  has been authorized for detection and/or diagnosis of SARS-CoV-2 by FDA under an Emergency Use Authorization (EUA). This EUA will remain  in effect (meaning this test can be used) for the duration of the COVID-19 declaration under Section 564(b)(1) of the Act, 21 U.S.C.section 360bbb-3(b)(1), unless the authorization is terminated  or revoked sooner.       Influenza A by PCR NEGATIVE NEGATIVE Final   Influenza B by PCR NEGATIVE NEGATIVE Final    Comment: (NOTE) The Xpert Xpress SARS-CoV-2/FLU/RSV plus assay is intended as an aid in the diagnosis of influenza from Nasopharyngeal swab specimens and should not be used as a sole basis for treatment. Nasal washings and aspirates are unacceptable for Xpert Xpress SARS-CoV-2/FLU/RSV testing.  Fact Sheet for Patients: EntrepreneurPulse.com.au  Fact Sheet for Healthcare Providers: IncredibleEmployment.be  This test is not yet approved or cleared by the [place] FDA and has been authorized for detection and/or diagnosis of SARS-CoV-2 by FDA under an Emergency Use Authorization (EUA). This EUA will remain in effect (meaning this test can be used) for the duration of the COVID-19 declaration  under Section 564(b)(1) of the Act, 21 U.S.C. section 360bbb-3(b)(1), unless the authorization is terminated or revoked.  Performed at [facility], [address] [place], [place] [address]   Blood Culture ID Panel (Reflexed)     Status: Abnormal   Collection Time: 09/12/21 12:19 AM  Result Value Ref Range Status   Enterococcus faecalis NOT DETECTED NOT DETECTED Final   Enterococcus Faecium NOT DETECTED NOT  DETECTED Final   Listeria monocytogenes NOT DETECTED NOT DETECTED Final   Staphylococcus species NOT DETECTED NOT DETECTED Final   Staphylococcus aureus (BCID) NOT DETECTED NOT DETECTED Final   Staphylococcus epidermidis NOT DETECTED NOT DETECTED Final   Staphylococcus lugdunensis NOT DETECTED NOT DETECTED Final   Streptococcus species NOT DETECTED NOT DETECTED Final   Streptococcus agalactiae NOT DETECTED NOT DETECTED Final   Streptococcus pneumoniae NOT DETECTED NOT DETECTED Final   Streptococcus pyogenes NOT DETECTED NOT DETECTED Final   A.calcoaceticus-baumannii NOT DETECTED NOT DETECTED Final   Bacteroides fragilis NOT DETECTED NOT DETECTED Final   Enterobacterales DETECTED (A) NOT DETECTED Final    Comment: Enterobacterales represent a large order of gram negative bacteria, not a single organism. CRITICAL RESULT CALLED TO, READ BACK BY AND VERIFIED WITH: KISHAN PATEL @ 1338 ON 09/12/21.Marland KitchenMarland KitchenTKR    Enterobacter cloacae complex NOT DETECTED NOT DETECTED Final   Escherichia coli NOT DETECTED NOT DETECTED Final   Klebsiella aerogenes NOT DETECTED NOT DETECTED Final   Klebsiella oxytoca NOT DETECTED NOT DETECTED Final   Klebsiella pneumoniae DETECTED (A) NOT DETECTED Final    Comment: CRITICAL RESULT CALLED TO, READ BACK BY AND VERIFIED WITH: KISHAN PATEL @ 9450 ON 09/12/21.Marland KitchenMarland KitchenTKR    Proteus species NOT DETECTED NOT DETECTED Final   Salmonella species NOT DETECTED NOT DETECTED Final   Serratia marcescens NOT DETECTED NOT DETECTED Final   Haemophilus influenzae NOT  DETECTED NOT DETECTED Final   Neisseria meningitidis NOT DETECTED NOT DETECTED Final   Pseudomonas aeruginosa NOT DETECTED NOT DETECTED Final   Stenotrophomonas maltophilia NOT DETECTED NOT DETECTED Final   Candida albicans NOT DETECTED NOT DETECTED Final   Candida auris NOT DETECTED NOT DETECTED Final   Candida glabrata NOT DETECTED NOT DETECTED Final   Candida krusei NOT DETECTED NOT DETECTED Final   Candida parapsilosis NOT DETECTED NOT DETECTED Final   Candida tropicalis NOT DETECTED NOT DETECTED Final   Cryptococcus neoformans/gattii NOT DETECTED NOT DETECTED Final   CTX-M ESBL DETECTED (A) NOT DETECTED Final    Comment: CRITICAL RESULT CALLED TO, READ BACK BY AND VERIFIED WITH: KISHAN PATEL @ 3888 ON 09/12/21.Marland KitchenMarland KitchenTKR (NOTE) Extended spectrum beta-lactamase detected. Recommend a carbapenem as initial therapy.      Carbapenem resistance IMP NOT DETECTED NOT DETECTED Final   Carbapenem resistance KPC NOT DETECTED NOT DETECTED Final   Carbapenem resistance NDM NOT DETECTED NOT DETECTED Final   Carbapenem resist OXA 48 LIKE NOT DETECTED NOT DETECTED Final   Carbapenem resistance VIM NOT DETECTED NOT DETECTED Final    Comment: Performed at [facility], [address], [place], [place] [address]         Radiology Studies: No results found.      Scheduled Meds:  aspirin EC  81 mg Oral Daily   atenolol  50 mg Oral Daily   enoxaparin (LOVENOX) injection  40 mg Subcutaneous Q24H   finasteride  5 mg Oral Daily   gabapentin  300 mg Oral Daily   rosuvastatin  20 mg Oral QHS   tamsulosin  0.4 mg Oral Daily   Continuous Infusions:  sodium chloride 75 mL/hr at 09/13/21 1734   meropenem (MERREM) IV 1 g (09/14/21 0737)     LOS: 2 days    Time spent: 25 mins     Wyvonnia Dusky, MD [facility] Pager [phone]  If 7PM-7AM, please contact night-coverage 09/14/2021, 8:08 AM

## 2021-09-15 ENCOUNTER — Inpatient Hospital Stay: Payer: Self-pay

## 2021-09-15 LAB — CULTURE, BLOOD (ROUTINE X 2): Special Requests: ADEQUATE

## 2021-09-15 LAB — BASIC METABOLIC PANEL
Anion gap: 6 (ref 5–15)
BUN: 12 mg/dL (ref 8–23)
CO2: 24 mmol/L (ref 22–32)
Calcium: 8.2 mg/dL — ABNORMAL LOW (ref 8.9–10.3)
Chloride: 107 mmol/L (ref 98–111)
Creatinine, Ser: 1.06 mg/dL (ref 0.61–1.24)
GFR, Estimated: >60 mL/min (ref >60)
Glucose, Bld: 116 mg/dL — ABNORMAL HIGH (ref 70–99)
Potassium: 3.6 mmol/L (ref 3.5–5.1)
Sodium: 137 mmol/L (ref 135–145)

## 2021-09-15 LAB — CBC
HCT: 34.4 % — ABNORMAL LOW (ref 39.0–52.0)
Hemoglobin: 12.2 g/dL — ABNORMAL LOW (ref 13.0–17.0)
MCH: 30.5 pg (ref 26.0–34.0)
MCHC: 35.5 g/dL (ref 30.0–36.0)
MCV: 86 fL (ref 80.0–100.0)
Platelets: 160 10*3/uL (ref 150–400)
RBC: 4 MIL/uL — ABNORMAL LOW (ref 4.22–5.81)
RDW: 12 % (ref 11.5–15.5)
WBC: 7.2 10*3/uL (ref 4.0–10.5)
nRBC: 0 % (ref 0.0–0.2)

## 2021-09-15 MED ORDER — HEPARIN SOD (PORK) LOCK FLUSH 100 UNIT/ML IV SOLN
250.0000 [IU] | INTRAVENOUS | Status: AC | PRN
  Administered 2021-09-15: 250 [IU]
  Filled 2021-09-15: qty 5

## 2021-09-15 MED ORDER — SODIUM CHLORIDE 0.9% FLUSH: 10.0000 mL | Freq: Two times a day (BID) | INTRAVENOUS | Status: DC

## 2021-09-15 MED ORDER — SODIUM CHLORIDE 0.9 % IV SOLN: 1.0000 g | INTRAVENOUS | Status: AC

## 2021-09-15 MED ORDER — SODIUM CHLORIDE 0.9% FLUSH: 10.0000 mL | INTRAVENOUS | Status: DC | PRN

## 2021-09-15 NOTE — Discharge Summary (Signed)
Physician Discharge Summary  Ernest Hutchinson UUV:[id number] DOB: 1950-10-22 DOA: 09/12/2021  PCP: Rusty Aus, MD  Admit date: 09/12/2021 Discharge date: 09/15/2021  Admitted From: home  Disposition:  home w/ home health   Recommendations for Outpatient Follow-up:  Follow up with PCP in 1-2 weeks F/u w/ ID, Dr. Delaine Lame, on 09/25/21 at 11:30 am   Home Health: yes  Equipment/Devices: PICC line for IV abxs at home   Discharge Condition: stable  CODE STATUS: full  Diet recommendation: Heart Healthy  Brief/Interim Summary: HPI was taken from Dr. Sidney Ace: Ernest Hutchinson is a 70 y.o. Caucasian male with medical history significant for hypertension, dyslipidemia and hypothyroidism, who presented to the emergency room with acute onset of dyspnea with associated fever of 100.4 at home with body aches, chills, chest tightness for the last day.  The patient stated that he thought he was having an MI as he was shaking and could not get warm.  No cough or wheezing.  No nausea or vomiting or abdominal pain.  He admitted to urinary frequency but denied any dysuria oliguria or hematuria or flank pain.   ED Course: When he came to the ER heart rate was 142 with a temperature of 99.7 and otherwise normal vital signs.  There are respiratory rate was 26 heart rate was down to 116.  Labs revealed lactic acid 2.9 later 2.1 and CMP was remarkable for hyponatremia of 131 borderline potassium of 3.5 and CO2 of 20 creatinine was 1.3 And calcium 8.6.  High-sensitivity troponin I was 12 and later 16 with BNP of 17.6.  CBC was within normal.  Influenza antigens and COVID-19 PCR came back negative.  UA showed 6-10 WBCs with few bacteria and trace leukocytes. EKG as reviewed by me : Pending. Imaging: Portable chest x-ray showed minimal left basilar atelectasis.  Chest CTA revealed no PE or acute intrathoracic pathology.  Showed heterogenous left thyroid gland with multiple nodules with recommendation for further  evaluation with ultrasound.  It showed aortic atherosclerosis.  The patient was given IV cefepime, vancomycin and Flagyl in 2.25 L of IV normal saline bolus.  He will be admitted to a cardiac telemetry bed for further evaluation and management.   Hospital course from Dr. Jimmye Norman 09/12/21: Pt was found to be septic from UTI and bacteremia. Urine cx & blood cxs grew ESBL klebsiella pneumoniae. Pt was d/c home w/ PICC & IV invanz until 09/27/21 as per ID. Pt will f/u w/ ID, Dr. Delaine Lame on 09/25/21 at 11:30 am. Repeat blood cxs were NGTD & sepsis had resolved prior to d/c.   Discharge Diagnoses:  Principal Problem:   UTI (urinary tract infection)  Sepsis: met criteria w/ tachycardia, tachypnea, elevated lactic acid & UTI & bacteremia. Continue on IV invanz. Urine cx is growing klebsiella pneumoniae. Sepsis resolved   UTI: continue on IV invanz until 09/27/21 as per ID. PICC line will be placed on the day of d/c. Urine cx growing klebsiella pneumoniae.  Bacteremia: blood cxs growing klebsiella pneumoniae, sens pending. Secondary to UTI. Continue on IV merrem. Repeat blood cxs ordered today. Likely will have to d/c home w/ PICC line & IV abxs. ID consulted   Thrombocytopenia: etiology unclear. Will continue to monitor   Leukocytosis: resolved   Hyponatremia: resolved   Lactic acidosis: resolved   Chest pain: w/ associated dyspnea. Troponins neg x 4. ACS r/o and no further cardiac work-up indicated as per cardio.  AKI: Cr is WNL and GFR is >60  Hypokalemia:  WNL today   HLD: continue on statin   BPH: continue on flomax, proscar. Will f/u outpatient w/ urology   HTN: continue on atenolol. Continue to hold lisinopril   Discharge Instructions  Discharge Instructions     Diet - low sodium heart healthy   Complete by: As directed    Discharge instructions   Complete by: As directed    F/u w/ PCP in 1-2 weeks. F/u w/ ID, Dr. Delaine Lame, on 09/25/21 11.30am. Weekly CBC, CMP while on  IV abxs as per ID. F/u w/ outpatient urologist in 1-2 weeks   Discharge instructions   Complete by: As directed    F/u w/ ID, Dr. Delaine Lame, on 09/25/21 at 11:30 am. Will need weekly CBC, CMP while on IV abxs as per ID. F/u w/ PCP in 1-2 weeks. F/u w/ outpatient urology in 1-2 weeks.   Increase activity slowly   Complete by: As directed    Increase activity slowly   Complete by: As directed       Allergies as of 09/15/2021       Reactions   Ceftin [cefuroxime] Anaphylaxis   Cefuroxime Axetil Anaphylaxis   Chlorhexidine Gluconate Itching        Medication List     TAKE these medications    atenolol 50 MG tablet Commonly known as: TENORMIN Take 50 mg by mouth daily. In morning   diazepam 5 MG tablet Commonly known as: VALIUM Take 5 mg by mouth at bedtime as needed.   ertapenem 1,000 mg in sodium chloride 0.9 % 100 mL Inject 1,000 mg into the vein daily for 11 days. Start taking on: September 16, 2021   etodolac 400 MG tablet Commonly known as: LODINE Take 400 mg by mouth 2 (two) times daily.   finasteride 5 MG tablet Commonly known as: PROSCAR Take 5 mg by mouth daily. In morning   gabapentin 300 MG capsule Commonly known as: NEURONTIN Take 300 mg by mouth daily. At bedtime   ibuprofen 200 MG tablet Commonly known as: ADVIL Take 400 mg by mouth 2 (two) times daily as needed.   lisinopril 5 MG tablet Commonly known as: ZESTRIL Take 5 mg by mouth 1 day or 1 dose. At bedtime   predniSONE 10 MG tablet Commonly known as: DELTASONE Take 20 mg by mouth daily. On day 2 of pred pak   rosuvastatin 20 MG tablet Commonly known as: CRESTOR Take 20 mg by mouth daily. At bedtime   tamsulosin 0.4 MG Caps capsule Commonly known as: FLOMAX Take 0.4 mg by mouth in the morning and at bedtime.        Allergies  Allergen Reactions   Ceftin [Cefuroxime] Anaphylaxis   Cefuroxime Axetil Anaphylaxis   Chlorhexidine Gluconate Itching     Consultations: ID   Procedures/Studies: CT Angio Chest PE W/Cm &/Or Wo Cm  Result Date: 09/12/2021 CLINICAL DATA:  Concern for pulmonary embolism. EXAM: CT ANGIOGRAPHY CHEST WITH CONTRAST TECHNIQUE: Multidetector CT imaging of the chest was performed using the standard protocol during bolus administration of intravenous contrast. Multiplanar CT image reconstructions and MIPs were obtained to evaluate the vascular anatomy. CONTRAST:  72m OMNIPAQUE IOHEXOL 350 MG/ML SOLN COMPARISON:  Chest radiograph dated 09/12/2021. FINDINGS: Cardiovascular: There is no cardiomegaly or pericardial effusion. Coronary vascular calcification. Mild atherosclerotic calcification of the thoracic aorta. No aneurysmal dilatation or dissection. The origins of the great vessels of the aortic arch appear patent as visualized. Evaluation of the pulmonary arteries is limited due to respiratory motion artifact  and suboptimal visualization of the peripheral branches. No pulmonary artery embolus identified. Mediastinum/Nodes: There is no hilar or mediastinal adenopathy. The esophagus is grossly unremarkable. Status post prior right hemithyroidectomy. The left thyroid gland is heterogeneous with multiple small ill-defined hypodense nodules which appear to measure up to 15 mm. Recommend thyroid US (ref: J Am Coll Radiol. 2015 Feb;12(2): 143-50).No mediastinal fluid collection. Lungs/Pleura: No focal consolidation, pleural effusion, pneumothorax. The central airways are patent. Upper Abdomen: No acute abnormality. Musculoskeletal: No acute osseous pathology. Review of the MIP images confirms the above findings. IMPRESSION: 1. No acute intrathoracic pathology. No CT evidence of pulmonary embolism. 2. Heterogeneous left thyroid gland with multiple nodules. Further evaluation with ultrasound on a nonemergent/outpatient basis recommended. 3. Aortic Atherosclerosis (ICD10-I70.0). Electronically Signed   By: Anner Crete M.D.   On:  09/12/2021 03:57   NM Bone Scan 3 Phase  Result Date: 09/06/2021 CLINICAL DATA:  Left knee pain. History of total knee arthroplasty 2 years ago. EXAM: NUCLEAR MEDICINE 3-PHASE BONE SCAN TECHNIQUE: Radionuclide angiographic images, immediate static blood pool images, and 3-hour delayed static images were obtained of the after intravenous injection of radiopharmaceutical. RADIOPHARMACEUTICALS:  22.26 mCi Tc-66mMDP IV COMPARISON:  None. FINDINGS: Vascular phase: Asymmetric increased blood flow to the left knee compared to the right knee. Blood pool phase: Asymmetric uptake around the left knee. Delayed phase: Abnormal areas of increased uptake around the femoral component more notably medially. 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 Electronically Signed   By: PMarijo SanesM.D.   On: 09/06/2021 16:19   [place]RENAL  Result Date: 09/14/2021 CLINICAL DATA:  UTI EXAM: RENAL / URINARY TRACT ULTRASOUND COMPLETE COMPARISON:  None. FINDINGS: Right Kidney: Renal measurements: 11.7 x 5.9 x 6.1 cm = volume: 221.6 mL. Technologist observed possible small scattered vascular calcifications. There is no hydronephrosis or perinephric fluid collection. Cortical echogenicity is unremarkable. Left Kidney: Renal measurements: 10.7 x 6.5 x 5.2 cm = volume: 188.3 mL. There is no hydronephrosis or perinephric fluid collection. There is no focal cortical thinning. Bladder: Unremarkable. Ureteral jets are observed at both ureterovesical junctions. Other: None. IMPRESSION: There is no hydronephrosis Electronically Signed   By: PElmer PickerM.D.   On: 09/14/2021 15:04   DG Chest Port 1 View  Result Date: 09/12/2021 CLINICAL DATA:  Shortness of breath and fevers EXAM: PORTABLE CHEST 1 VIEW COMPARISON:  None. FINDINGS: Cardiac shadow is within normal limits. Lungs are well aerated bilaterally. Minimal left basilar atelectasis is seen. No bony abnormality is noted. IMPRESSION:  Minimal left basilar atelectasis. Electronically Signed   By: MInez CatalinaM.D.   On: 09/12/2021 00:31   [place]KG SITE RITE  Result Date: 09/15/2021 If Site Rite image not attached, placement could not be confirmed due to current cardiac rhythm.  (Echo, Carotid, EGD, Colonoscopy, ERCP)    Subjective: Pt denies any complaints    Discharge Exam: Vitals:   09/15/21 0838 09/15/21 1130  BP: 133/73 113/65  Pulse: 72 73  Resp: 16 18  Temp: 98.4 F (36.9 C) 98.1 F (36.7 C)  SpO2: 96% 96%   Vitals:   09/15/21 0400 09/15/21 0627 09/15/21 0838 09/15/21 1130  BP: 120/65  133/73 113/65  Pulse: 88  72 73  Resp: _0 Temp: 99.3 F (37.4 C) 98.8 F (37.1 C) 98.4 F (36.9 C) 98.1 F (36.7 C)  TempSrc: Oral Oral Oral Oral  SpO2: 98%  96% 96%  Weight:  Height:        General: Pt is alert, awake, not in acute distress Cardiovascular: S1/S2 +, no rubs, no gallops Respiratory: CTA bilaterally, no wheezing, no rhonchi Abdominal: Soft, NT, ND, bowel sounds + Extremities: no edema, no cyanosis    The results of significant diagnostics from this hospitalization (including imaging, microbiology, ancillary and laboratory) are listed below for reference.     Microbiology: Recent Results (from the past 240 hour(s))  Culture, blood (routine x 2)     Status: Abnormal   Collection Time: 09/12/21 12:19 AM   Specimen: BLOOD RIGHT FOREARM  Result Value Ref Range Status   Specimen Description   Final    BLOOD RIGHT FOREARM Performed at [facility], [address] [place], [place] [address]    Special Requests   Final    BOTTLES DRAWN AEROBIC AND ANAEROBIC Blood Culture adequate volume Performed at [facility], [address] [place], [place] [address]    Culture  Setup Time   Final    GRAM NEGATIVE RODS IN BOTH AEROBIC AND ANAEROBIC BOTTLES CRITICAL RESULT CALLED TO, READ BACK BY AND VERIFIED WITH: KISHAN PATEL @ 2951 ON 09/12/21.Marland KitchenMarland KitchenTKR    Culture (A)   Final    KLEBSIELLA PNEUMONIAE Confirmed Extended Spectrum Beta-Lactamase Producer (ESBL).  In bloodstream infections from ESBL organisms, carbapenems are preferred over piperacillin/tazobactam. They are shown to have a lower risk of mortality. MULTI-DRUG RESISTANT ORGANISM RESULT CALLED TO, READ BACK BY AND VERIFIED WITH: PHARM D A.CHAPPELL ON [id number] AT 0908 BY E.PARRISH Performed at [facility], [place] [address], [place], [place] [address]    Report Status 09/15/2021 FINAL  Final   Organism ID, Bacteria KLEBSIELLA PNEUMONIAE  Final      Susceptibility   Klebsiella pneumoniae - MIC*    AMPICILLIN >=32 RESISTANT Resistant     CEFAZOLIN >=64 RESISTANT Resistant     CEFEPIME >=32 RESISTANT Resistant     CEFTAZIDIME >=64 RESISTANT Resistant     CEFTRIAXONE >=64 RESISTANT Resistant     CIPROFLOXACIN 1 RESISTANT Resistant     GENTAMICIN >=16 RESISTANT Resistant     IMIPENEM 1 SENSITIVE Sensitive     TRIMETH/SULFA >=320 RESISTANT Resistant     AMPICILLIN/SULBACTAM >=32 RESISTANT Resistant     PIP/TAZO 32 INTERMEDIATE Intermediate     * KLEBSIELLA PNEUMONIAE  Culture, blood (routine x 2)     Status: Abnormal (Preliminary result)   Collection Time: 09/12/21 12:19 AM   Specimen: BLOOD  Result Value Ref Range Status   Specimen Description   Final    BLOOD LEFT HAND Performed at [facility], [address] [place], [place] [address]    Special Requests   Final    BOTTLES DRAWN AEROBIC AND ANAEROBIC Blood Culture results may not be optimal due to an inadequate volume of blood received in culture bottles Performed at [facility], [address] [place], [place] [address]    Culture  Setup Time   Final    ANAEROBIC BOTTLE ONLY GRAM NEGATIVE RODS CRITICAL VALUE NOTED.  VALUE IS CONSISTENT WITH PREVIOUSLY REPORTED AND CALLED VALUE.    Culture (A)  Final    KLEBSIELLA PNEUMONIAE SUSCEPTIBILITIES PERFORMED ON PREVIOUS CULTURE WITHIN THE LAST 5 DAYS. Performed  at [facility], [place] [address], [place], [place] [address]    Report Status PENDING  Incomplete  Urine Culture     Status: Abnormal   Collection Time: 09/12/21 12:19 AM   Specimen: Urine,  Clean Catch  Result Value Ref Range Status   Specimen Description   Final    URINE, CLEAN CATCH Performed at [facility], [address] [place] [place], [place] [address]    Special Requests   Final    NONE Performed at [facility], [place], [place] [address]    Culture 50,000 COLONIES/mL KLEBSIELLA PNEUMONIAE (A)  Final   Report Status 09/14/2021 FINAL  Final   Organism ID, Bacteria KLEBSIELLA PNEUMONIAE (A)  Final      Susceptibility   Klebsiella pneumoniae - MIC*    AMPICILLIN >=32 RESISTANT Resistant     CEFAZOLIN >=64 RESISTANT Resistant     CEFEPIME >=32 RESISTANT Resistant     CEFTRIAXONE >=64 RESISTANT Resistant     CIPROFLOXACIN 2 RESISTANT Resistant     GENTAMICIN >=16 RESISTANT Resistant     IMIPENEM 0.5 SENSITIVE Sensitive     NITROFURANTOIN 128 RESISTANT Resistant     TRIMETH/SULFA >=320 RESISTANT Resistant     AMPICILLIN/SULBACTAM >=32 RESISTANT Resistant     PIP/TAZO 32 INTERMEDIATE Intermediate     * 50,000 COLONIES/mL KLEBSIELLA PNEUMONIAE  Resp Panel by RT-PCR (Flu A&B, Covid) Nasopharyngeal Swab     Status: None   Collection Time: 09/12/21 12:19 AM   Specimen: Nasopharyngeal Swab; Nasopharyngeal(NP) swabs in vial transport medium  Result Value Ref Range Status   SARS Coronavirus 2 by RT PCR NEGATIVE NEGATIVE Final    Comment: (NOTE) SARS-CoV-2 target nucleic acids are NOT DETECTED.  The SARS-CoV-2 RNA is generally detectable in upper respiratory specimens during the acute phase of infection. The lowest concentration of SARS-CoV-2 viral copies this assay can detect is 138 copies/mL. A negative result does not preclude SARS-Cov-2 infection and should not be used as the sole basis for treatment or other patient management decisions.  A negative result may occur with  improper specimen collection/handling, submission of specimen other than nasopharyngeal swab, presence of viral mutation(s) within the areas targeted by this assay, and inadequate number of viral copies(<138 copies/mL). A negative result must be combined with clinical observations, patient history, and epidemiological information. The expected result is Negative.  Fact Sheet for Patients:  EntrepreneurPulse.com.au  Fact Sheet for Healthcare Providers:  IncredibleEmployment.be  This test is no t yet approved or cleared by the [place] FDA and  has been authorized for detection and/or diagnosis of SARS-CoV-2 by FDA under an Emergency Use Authorization (EUA). This EUA will remain  in effect (meaning this test can be used) for the duration of the COVID-19 declaration under Section 564(b)(1) of the Act, 21 U.S.C.section 360bbb-3(b)(1), unless the authorization is terminated  or revoked sooner.       Influenza A by PCR NEGATIVE NEGATIVE Final   Influenza B by PCR NEGATIVE NEGATIVE Final    Comment: (NOTE) The Xpert Xpress SARS-CoV-2/FLU/RSV plus assay is intended as an aid in the diagnosis of influenza from Nasopharyngeal swab specimens and should not be used as a sole basis for treatment. Nasal washings and aspirates are unacceptable for Xpert Xpress SARS-CoV-2/FLU/RSV testing.  Fact Sheet for Patients: EntrepreneurPulse.com.au  Fact Sheet for Healthcare Providers: IncredibleEmployment.be  This test is not yet approved or cleared by the [place] FDA and has been authorized for detection and/or diagnosis of SARS-CoV-2 by FDA under an Emergency Use Authorization (EUA). This EUA will remain in effect (meaning this test can be used) for the duration of the COVID-19 declaration under Section 564(b)(1) of the Act, 21 U.S.C. section 360bbb-3(b)(1), unless the authorization  is terminated or revoked.  Performed at [facility], [address] [place], [place] [address]   Blood Culture ID Panel (Reflexed)     Status: Abnormal   Collection Time: 09/12/21 12:19 AM  Result Value Ref Range Status   Enterococcus faecalis NOT DETECTED NOT DETECTED Final   Enterococcus Faecium NOT DETECTED NOT DETECTED Final   Listeria monocytogenes NOT DETECTED NOT DETECTED Final   Staphylococcus species NOT DETECTED NOT DETECTED Final   Staphylococcus aureus (BCID) NOT DETECTED NOT DETECTED Final   Staphylococcus epidermidis NOT DETECTED NOT DETECTED Final   Staphylococcus lugdunensis NOT DETECTED NOT DETECTED Final   Streptococcus species NOT DETECTED NOT DETECTED Final   Streptococcus agalactiae NOT DETECTED NOT DETECTED Final   Streptococcus pneumoniae NOT DETECTED NOT DETECTED Final   Streptococcus pyogenes NOT DETECTED NOT DETECTED Final   A.calcoaceticus-baumannii NOT DETECTED NOT DETECTED Final   Bacteroides fragilis NOT DETECTED NOT DETECTED Final   Enterobacterales DETECTED (A) NOT DETECTED Final    Comment: Enterobacterales represent a large order of gram negative bacteria, not a single organism. CRITICAL RESULT CALLED TO, READ BACK BY AND VERIFIED WITH: KISHAN PATEL @ 1338 ON 09/12/21.Marland KitchenMarland KitchenTKR    Enterobacter cloacae complex NOT DETECTED NOT DETECTED Final   Escherichia coli NOT DETECTED NOT DETECTED Final   Klebsiella aerogenes NOT DETECTED NOT DETECTED Final   Klebsiella oxytoca NOT DETECTED NOT DETECTED Final   Klebsiella pneumoniae DETECTED (A) NOT DETECTED Final    Comment: CRITICAL RESULT CALLED TO, READ BACK BY AND VERIFIED WITH: KISHAN PATEL @ 7588 ON 09/12/21.Marland KitchenMarland KitchenTKR    Proteus species NOT DETECTED NOT DETECTED Final   Salmonella species NOT DETECTED NOT DETECTED Final   Serratia marcescens NOT DETECTED NOT DETECTED Final   Haemophilus influenzae NOT DETECTED NOT DETECTED Final   Neisseria meningitidis NOT DETECTED NOT DETECTED Final    Pseudomonas aeruginosa NOT DETECTED NOT DETECTED Final   Stenotrophomonas maltophilia NOT DETECTED NOT DETECTED Final   Candida albicans NOT DETECTED NOT DETECTED Final   Candida auris NOT DETECTED NOT DETECTED Final   Candida glabrata NOT DETECTED NOT DETECTED Final   Candida krusei NOT DETECTED NOT DETECTED Final   Candida parapsilosis NOT DETECTED NOT DETECTED Final   Candida tropicalis NOT DETECTED NOT DETECTED Final   Cryptococcus neoformans/gattii NOT DETECTED NOT DETECTED Final   CTX-M ESBL DETECTED (A) NOT DETECTED Final    Comment: CRITICAL RESULT CALLED TO, READ BACK BY AND VERIFIED WITH: KISHAN PATEL @ 3254 ON 09/12/21.Marland KitchenMarland KitchenTKR (NOTE) Extended spectrum beta-lactamase detected. Recommend a carbapenem as initial therapy.      Carbapenem resistance IMP NOT DETECTED NOT DETECTED Final   Carbapenem resistance KPC NOT DETECTED NOT DETECTED Final   Carbapenem resistance NDM NOT DETECTED NOT DETECTED Final   Carbapenem resist OXA 48 LIKE NOT DETECTED NOT DETECTED Final   Carbapenem resistance VIM NOT DETECTED NOT DETECTED Final    Comment: Performed at [facility], [address] [place], [place] [address]  CULTURE, BLOOD (ROUTINE X 2) w Reflex to ID Panel     Status: None (Preliminary result)   Collection Time: 09/14/21  9:56 AM   Specimen: BLOOD  Result Value Ref Range Status   Specimen Description BLOOD LEFT ANTECUBITAL  Final   Special Requests   Final    BOTTLES DRAWN AEROBIC AND ANAEROBIC Blood Culture results may not be optimal due to an excessive volume of blood received in culture bottles   Culture   Final    NO GROWTH < 24 HOURS  Performed at [facility], [address] [place], [place] [address]    Report Status PENDING  Incomplete  CULTURE, BLOOD (ROUTINE X 2) w Reflex to ID Panel     Status: None (Preliminary result)   Collection Time: 09/14/21  9:56 AM   Specimen: BLOOD  Result Value Ref Range Status   Specimen Description BLOOD BLOOD LEFT  HAND  Final   Special Requests   Final    BOTTLES DRAWN AEROBIC AND ANAEROBIC Blood Culture adequate volume   Culture   Final    NO GROWTH < 24 HOURS Performed at [facility], [address] [place], [place] [address]    Report Status PENDING  Incomplete     Labs: BNP (last 3 results) Recent Labs    09/12/21 0019  BNP 23.5   Basic Metabolic Panel: Recent Labs  Lab 09/12/21 0019 09/13/21 0734 09/14/21 0427 09/15/21 0526  NA 131* 135 134* 137  K 3.5 3.4* 3.5 3.6  CL 102 107 108 107  CO2 20* _0 GLUCOSE 121* 107* 109* 116*  BUN _1 CREATININE 1.31* 1.22 1.12 1.06  CALCIUM 8.6* 7.8* 7.6* 8.2*   Liver Function Tests: Recent Labs  Lab 09/12/21 0019  AST 25  ALT 32  ALKPHOS 77  BILITOT 1.1  PROT 7.1  ALBUMIN 3.6   Recent Labs  Lab 09/12/21 0019  LIPASE 53*   No results for input(s): AMMONIA in the last 168 hours. CBC: Recent Labs  Lab 09/12/21 0019 09/13/21 0734 09/14/21 0427 09/15/21 0526  WBC 6.3 11.5* 7.8 7.2  NEUTROABS 5.4  --   --   --   HGB 14.3 11.9* 11.8* 12.2*  HCT 40.0 34.7* 33.4* 34.4*  MCV 86.6 87.2 86.8 86.0  PLT 155 120* 138* 160   Cardiac Enzymes: No results for input(s): CKTOTAL, CKMB, CKMBINDEX, TROPONINI in the last 168 hours. BNP: Invalid input(s): POCBNP CBG: No results for input(s): GLUCAP in the last 168 hours. D-Dimer No results for input(s): DDIMER in the last 72 hours. Hgb A1c No results for input(s): HGBA1C in the last 72 hours. Lipid Profile No results for input(s): CHOL, HDL, LDLCALC, TRIG, CHOLHDL, LDLDIRECT in the last 72 hours. Thyroid function studies No results for input(s): TSH, T4TOTAL, T3FREE, THYROIDAB in the last 72 hours.  Invalid input(s): FREET3 Anemia work up No results for input(s): VITAMINB12, FOLATE, FERRITIN, TIBC, IRON, RETICCTPCT in the last 72 hours. Urinalysis    Component Value Date/Time   COLORURINE YELLOW (A) 09/12/2021 0019   APPEARANCEUR HAZY (A) 09/12/2021  0019   LABSPEC 1.009 09/12/2021 0019   PHURINE 5.0 09/12/2021 0019   GLUCOSEU NEGATIVE 09/12/2021 0019   HGBUR MODERATE (A) 09/12/2021 0019   BILIRUBINUR NEGATIVE 09/12/2021 0019   KETONESUR NEGATIVE 09/12/2021 0019   PROTEINUR NEGATIVE 09/12/2021 0019   NITRITE NEGATIVE 09/12/2021 0019   LEUKOCYTESUR TRACE (A) 09/12/2021 0019   Sepsis Labs Invalid input(s): PROCALCITONIN,  WBC,  LACTICIDVEN Microbiology Recent Results (from the past 240 hour(s))  Culture, blood (routine x 2)     Status: Abnormal   Collection Time: 09/12/21 12:19 AM   Specimen: BLOOD RIGHT FOREARM  Result Value Ref Range Status   Specimen Description   Final    BLOOD RIGHT FOREARM Performed at [facility], [address] [place], [place] [address]    Special Requests   Final    BOTTLES DRAWN AEROBIC AND ANAEROBIC Blood Culture adequate volume Performed at [facility],  [place], [place] [address]    Culture  Setup Time   Final    GRAM NEGATIVE RODS IN BOTH AEROBIC AND ANAEROBIC BOTTLES CRITICAL RESULT CALLED TO, READ BACK BY AND VERIFIED WITH: KISHAN PATEL @ 1338 ON 09/12/21.Marland KitchenMarland KitchenTKR    Culture (A)  Final    KLEBSIELLA PNEUMONIAE Confirmed Extended Spectrum Beta-Lactamase Producer (ESBL).  In bloodstream infections from ESBL organisms, carbapenems are preferred over piperacillin/tazobactam. They are shown to have a lower risk of mortality. MULTI-DRUG RESISTANT ORGANISM RESULT CALLED TO, READ BACK BY AND VERIFIED WITH: PHARM D A.CHAPPELL ON [id number] AT 0908 BY E.PARRISH Performed at [facility], [place] [address] [place], [place] [address]    Report Status 09/15/2021 FINAL  Final   Organism ID, Bacteria KLEBSIELLA PNEUMONIAE  Final      Susceptibility   Klebsiella pneumoniae - MIC*    AMPICILLIN >=32 RESISTANT Resistant     CEFAZOLIN >=64 RESISTANT Resistant     CEFEPIME >=32 RESISTANT Resistant     CEFTAZIDIME >=64 RESISTANT Resistant     CEFTRIAXONE >=64 RESISTANT  Resistant     CIPROFLOXACIN 1 RESISTANT Resistant     GENTAMICIN >=16 RESISTANT Resistant     IMIPENEM 1 SENSITIVE Sensitive     TRIMETH/SULFA >=320 RESISTANT Resistant     AMPICILLIN/SULBACTAM >=32 RESISTANT Resistant     PIP/TAZO 32 INTERMEDIATE Intermediate     * KLEBSIELLA PNEUMONIAE  Culture, blood (routine x 2)     Status: Abnormal (Preliminary result)   Collection Time: 09/12/21 12:19 AM   Specimen: BLOOD  Result Value Ref Range Status   Specimen Description   Final    BLOOD LEFT HAND Performed at [facility], [address] [place], [place] [address]    Special Requests   Final    BOTTLES DRAWN AEROBIC AND ANAEROBIC Blood Culture results may not be optimal due to an inadequate volume of blood received in culture bottles Performed at [facility], [address] [place], [place] [address]    Culture  Setup Time   Final    ANAEROBIC BOTTLE ONLY GRAM NEGATIVE RODS CRITICAL VALUE NOTED.  VALUE IS CONSISTENT WITH PREVIOUSLY REPORTED AND CALLED VALUE.    Culture (A)  Final    KLEBSIELLA PNEUMONIAE SUSCEPTIBILITIES PERFORMED ON PREVIOUS CULTURE WITHIN THE LAST 5 DAYS. Performed at [facility], [place] [address], [place],  [address]    Report Status PENDING  Incomplete  Urine Culture     Status: Abnormal   Collection Time: 09/12/21 12:19 AM   Specimen: Urine, Clean Catch  Result Value Ref Range Status   Specimen Description   Final    URINE, CLEAN CATCH Performed at [facility], [address] [place],  [address]    Special Requests   Final    NONE Performed at [facility], [place], [place] [address]    Culture 50,000 COLONIES/mL KLEBSIELLA PNEUMONIAE (A)  Final   Report Status 09/14/2021 FINAL  Final   Organism ID, Bacteria KLEBSIELLA PNEUMONIAE (A)  Final      Susceptibility   Klebsiella pneumoniae - MIC*    AMPICILLIN >=32 RESISTANT Resistant     CEFAZOLIN >=64 RESISTANT Resistant      CEFEPIME >=32 RESISTANT Resistant     CEFTRIAXONE >=64 RESISTANT Resistant     CIPROFLOXACIN 2 RESISTANT Resistant     GENTAMICIN >=16 RESISTANT Resistant     IMIPENEM 0.5 SENSITIVE Sensitive     NITROFURANTOIN  128 RESISTANT Resistant     TRIMETH/SULFA >=320 RESISTANT Resistant     AMPICILLIN/SULBACTAM >=32 RESISTANT Resistant     PIP/TAZO 32 INTERMEDIATE Intermediate     * 50,000 COLONIES/mL KLEBSIELLA PNEUMONIAE  Resp Panel by RT-PCR (Flu A&B, Covid) Nasopharyngeal Swab     Status: None   Collection Time: 09/12/21 12:19 AM   Specimen: Nasopharyngeal Swab; Nasopharyngeal(NP) swabs in vial transport medium  Result Value Ref Range Status   SARS Coronavirus 2 by RT PCR NEGATIVE NEGATIVE Final    Comment: (NOTE) SARS-CoV-2 target nucleic acids are NOT DETECTED.  The SARS-CoV-2 RNA is generally detectable in upper respiratory specimens during the acute phase of infection. The lowest concentration of SARS-CoV-2 viral copies this assay can detect is 138 copies/mL. A negative result does not preclude SARS-Cov-2 infection and should not be used as the sole basis for treatment or other patient management decisions. A negative result may occur with  improper specimen collection/handling, submission of specimen other than nasopharyngeal swab, presence of viral mutation(s) within the areas targeted by this assay, and inadequate number of viral copies(<138 copies/mL). A negative result must be combined with clinical observations, patient history, and epidemiological information. The expected result is Negative.  Fact Sheet for Patients:  EntrepreneurPulse.com.au  Fact Sheet for Healthcare Providers:  IncredibleEmployment.be  This test is no t yet approved or cleared by the [place] FDA and  has been authorized for detection and/or diagnosis of SARS-CoV-2 by FDA under an Emergency Use Authorization (EUA). This EUA will remain  in effect (meaning this  test can be used) for the duration of the COVID-19 declaration under Section 564(b)(1) of the Act, 21 U.S.C.section 360bbb-3(b)(1), unless the authorization is terminated  or revoked sooner.       Influenza A by PCR NEGATIVE NEGATIVE Final   Influenza B by PCR NEGATIVE NEGATIVE Final    Comment: (NOTE) The Xpert Xpress SARS-CoV-2/FLU/RSV plus assay is intended as an aid in the diagnosis of influenza from Nasopharyngeal swab specimens and should not be used as a sole basis for treatment. Nasal washings and aspirates are unacceptable for Xpert Xpress SARS-CoV-2/FLU/RSV testing.  Fact Sheet for Patients: EntrepreneurPulse.com.au  Fact Sheet for Healthcare Providers: IncredibleEmployment.be  This test is not yet approved or cleared by the [place] FDA and has been authorized for detection and/or diagnosis of SARS-CoV-2 by FDA under an Emergency Use Authorization (EUA). This EUA will remain in effect (meaning this test can be used) for the duration of the COVID-19 declaration under Section 564(b)(1) of the Act, 21 U.S.C. section 360bbb-3(b)(1), unless the authorization is terminated or revoked.  Performed at [facility], [address] [place], [place] [address]   Blood Culture ID Panel (Reflexed)     Status: Abnormal   Collection Time: 09/12/21 12:19 AM  Result Value Ref Range Status   Enterococcus faecalis NOT DETECTED NOT DETECTED Final   Enterococcus Faecium NOT DETECTED NOT DETECTED Final   Listeria monocytogenes NOT DETECTED NOT DETECTED Final   Staphylococcus species NOT DETECTED NOT DETECTED Final   Staphylococcus aureus (BCID) NOT DETECTED NOT DETECTED Final   Staphylococcus epidermidis NOT DETECTED NOT DETECTED Final   Staphylococcus lugdunensis NOT DETECTED NOT DETECTED Final   Streptococcus species NOT DETECTED NOT DETECTED Final   Streptococcus agalactiae NOT DETECTED NOT DETECTED Final   Streptococcus pneumoniae  NOT DETECTED NOT DETECTED Final   Streptococcus pyogenes NOT DETECTED NOT DETECTED Final   A.calcoaceticus-baumannii NOT DETECTED NOT DETECTED Final   Bacteroides fragilis NOT DETECTED  NOT DETECTED Final   Enterobacterales DETECTED (A) NOT DETECTED Final    Comment: Enterobacterales represent a large order of gram negative bacteria, not a single organism. CRITICAL RESULT CALLED TO, READ BACK BY AND VERIFIED WITH: KISHAN PATEL @ 1338 ON 09/12/21.Marland KitchenMarland KitchenTKR    Enterobacter cloacae complex NOT DETECTED NOT DETECTED Final   Escherichia coli NOT DETECTED NOT DETECTED Final   Klebsiella aerogenes NOT DETECTED NOT DETECTED Final   Klebsiella oxytoca NOT DETECTED NOT DETECTED Final   Klebsiella pneumoniae DETECTED (A) NOT DETECTED Final    Comment: CRITICAL RESULT CALLED TO, READ BACK BY AND VERIFIED WITH: KISHAN PATEL @ 6734 ON 09/12/21.Marland KitchenMarland KitchenTKR    Proteus species NOT DETECTED NOT DETECTED Final   Salmonella species NOT DETECTED NOT DETECTED Final   Serratia marcescens NOT DETECTED NOT DETECTED Final   Haemophilus influenzae NOT DETECTED NOT DETECTED Final   Neisseria meningitidis NOT DETECTED NOT DETECTED Final   Pseudomonas aeruginosa NOT DETECTED NOT DETECTED Final   Stenotrophomonas maltophilia NOT DETECTED NOT DETECTED Final   Candida albicans NOT DETECTED NOT DETECTED Final   Candida auris NOT DETECTED NOT DETECTED Final   Candida glabrata NOT DETECTED NOT DETECTED Final   Candida krusei NOT DETECTED NOT DETECTED Final   Candida parapsilosis NOT DETECTED NOT DETECTED Final   Candida tropicalis NOT DETECTED NOT DETECTED Final   Cryptococcus neoformans/gattii NOT DETECTED NOT DETECTED Final   CTX-M ESBL DETECTED (A) NOT DETECTED Final    Comment: CRITICAL RESULT CALLED TO, READ BACK BY AND VERIFIED WITH: KISHAN PATEL @ 1937 ON 09/12/21.Marland KitchenMarland KitchenTKR (NOTE) Extended spectrum beta-lactamase detected. Recommend a carbapenem as initial therapy.      Carbapenem resistance IMP NOT DETECTED NOT  DETECTED Final   Carbapenem resistance KPC NOT DETECTED NOT DETECTED Final   Carbapenem resistance NDM NOT DETECTED NOT DETECTED Final   Carbapenem resist OXA 48 LIKE NOT DETECTED NOT DETECTED Final   Carbapenem resistance VIM NOT DETECTED NOT DETECTED Final    Comment: Performed at [facility], [address] [place], [place] [address]  CULTURE, BLOOD (ROUTINE X 2) w Reflex to ID Panel     Status: None (Preliminary result)   Collection Time: 09/14/21  9:56 AM   Specimen: BLOOD  Result Value Ref Range Status   Specimen Description BLOOD LEFT ANTECUBITAL  Final   Special Requests   Final    BOTTLES DRAWN AEROBIC AND ANAEROBIC Blood Culture results may not be optimal due to an excessive volume of blood received in culture bottles   Culture   Final    NO GROWTH < 24 HOURS Performed at [facility], [address] [place], [place] [address]    Report Status PENDING  Incomplete  CULTURE, BLOOD (ROUTINE X 2) w Reflex to ID Panel     Status: None (Preliminary result)   Collection Time: 09/14/21  9:56 AM   Specimen: BLOOD  Result Value Ref Range Status   Specimen Description BLOOD BLOOD LEFT HAND  Final   Special Requests   Final    BOTTLES DRAWN AEROBIC AND ANAEROBIC Blood Culture adequate volume   Culture   Final    NO GROWTH < 24 HOURS Performed at [facility], [address] [place],  [address]    Report Status PENDING  Incomplete     Time coordinating discharge: Over 30 minutes  SIGNED:   Wyvonnia Dusky, MD  [facility] 09/15/2021, 2:53 PM Pager   If 7PM-7AM, please contact night-coverage

## 2021-09-15 NOTE — TOC Transition Note (Addendum)
Transition of Care [facility]) - CM/SW Discharge Note   Patient Details  Name: Ernest Hutchinson MRN: [id number] Date of Birth: 07-18-1951  Transition of [facility]) CM/SW Contact:  [name], LCSW Phone Number: 09/15/2021, 11:41 AM   Clinical Narrative:   Per MD plan to DC today after PICC placement. Updated patient's wife via phone, [name] with [name], and [name] with [facility]. Per [name], patient will need 1st dose of Ivanz today before DC, notified MD. Informed [name] that per MD patient and wife will need written instructions to refer to on IV meds if possible. Per [name], video and written instructions have already been provided, [name] also did bedside teaching on 11/25.  Confirmed PCP is Dr. Emily Filbert. Wife to pick up when DC. Provided CSW's number to wife if any questions arise.       Final next level of care: [name] Barriers to Discharge: Barriers Resolved   Patient Goals and CMS Choice Patient states their goals for this hospitalization and ongoing recovery are:: home with home health CMS Medicare.gov Compare Post Acute Care list provided to:: Patient Represenative (must comment) Choice offered to / list presented to : Spouse  Discharge Placement                  Name of family member notified: [name] - wife Patient and family notified of of transfer: 09/15/21  Discharge Plan and Services                DME Arranged: IV pump/equipment   Date DME Agency Contacted: 09/15/21   Representative spoke with at DME Agency: [place] Arranged: RN [name] Agency: [place] Date [place]: 09/15/21   Representative spoke with at [place]: [place] [facility]  Social Determinants of Health (SDOH) Interventions     Readmission Risk Interventions No flowsheet data found.

## 2021-09-15 NOTE — Progress Notes (Signed)
Peripherally Inserted Central Catheter Placement  The IV Nurse has discussed with the patient and/or persons authorized to consent for the patient, the purpose of this procedure and the potential benefits and risks involved with this procedure.  The benefits include less needle sticks, lab draws from the catheter, and the patient may be discharged home with the catheter. Risks include, but not limited to, infection, bleeding, blood clot (thrombus formation), and puncture of an artery; nerve damage and irregular heartbeat and possibility to perform a PICC exchange if needed/ordered by physician.  Alternatives to this procedure were also discussed.  [facility] PICC patient education guide, fact sheet on infection prevention and patient information card has been provided to patient /or left at bedside.    PICC Placement Documentation  PICC Single Lumen 86/76/19 Right Basilic 39 cm 0 cm (Active)  Indication for Insertion or Continuance of Line Home intravenous therapies (PICC only) 09/15/21 1648  Exposed Catheter (cm) 0 cm 09/15/21 1648  Site Assessment Clean;Dry;Intact 09/15/21 1648  Line Status Flushed;Saline locked;Blood return noted 09/15/21 1648  Dressing Type Transparent;Securing device 09/15/21 1648  Dressing Status Clean;Dry;Intact 09/15/21 1648  Antimicrobial disc in place? Yes 09/15/21 1648  Safety Lock Not Applicable 50/93/26 7124  Line Care Connections checked and tightened 09/15/21 1648  Line Adjustment (NICU/IV Team Only) No 09/15/21 1648  Dressing Intervention New dressing 09/15/21 1648  Dressing Change Due 09/22/21 09/15/21 1648       Rolena Infante 09/15/2021, 4:53 PM

## 2021-09-15 NOTE — Progress Notes (Signed)
Plan PICC placement approximately 1430.  Colletta Maryland RN states approval, after Invanz infusion and pt lunch.

## 2021-09-15 NOTE — Progress Notes (Signed)
Dr Drucilla Schmidt states ok to place PICC with pending blood cultures, drawn 09/14/21

## 2021-09-15 NOTE — Progress Notes (Signed)
Patient discharged to home with wife.  PICC line placed prior to discharge for home IV antibiotic administration.  Educated wife and patient on PICC line flush.  Wife demonstrated correct technique for cleaning and flushing picc line with saline.  Tele and peripheral IV d/c'd prior to discharge.

## 2021-09-16 LAB — CULTURE, BLOOD (ROUTINE X 2)

## 2021-09-19 LAB — CULTURE, BLOOD (ROUTINE X 2)
Culture: NO GROWTH
Culture: NO GROWTH
Special Requests: ADEQUATE

## 2021-09-25 ENCOUNTER — Ambulatory Visit: Payer: Medicare Other | Attending: Infectious Diseases | Admitting: Infectious Diseases

## 2021-09-25 ENCOUNTER — Other Ambulatory Visit
Admission: RE | Admit: 2021-09-25 | Discharge: 2021-09-25 | Disposition: A | Discharge status: Home / Self Care | Payer: Medicare Other | Source: Ambulatory Visit | Attending: Infectious Diseases | Admitting: Infectious Diseases

## 2021-09-25 ENCOUNTER — Other Ambulatory Visit: Payer: Self-pay

## 2021-09-25 VITALS — BP 109/72 | HR 102 | Resp 16 | Ht 66.0 in | Wt 150.0 lb

## 2021-09-25 DIAGNOSIS — Z959 Presence of cardiac and vascular implant and graft, unspecified: Secondary | ICD-10-CM | POA: Insufficient documentation

## 2021-09-25 DIAGNOSIS — E039 Hypothyroidism, unspecified: Secondary | ICD-10-CM | POA: Insufficient documentation

## 2021-09-25 DIAGNOSIS — B961 Klebsiella pneumoniae [K. pneumoniae] as the cause of diseases classified elsewhere: Secondary | ICD-10-CM | POA: Diagnosis not present

## 2021-09-25 DIAGNOSIS — N390 Urinary tract infection, site not specified: Secondary | ICD-10-CM | POA: Insufficient documentation

## 2021-09-25 DIAGNOSIS — R7881 Bacteremia: Secondary | ICD-10-CM | POA: Insufficient documentation

## 2021-09-25 DIAGNOSIS — E785 Hyperlipidemia, unspecified: Secondary | ICD-10-CM | POA: Insufficient documentation

## 2021-09-25 DIAGNOSIS — Z79899 Other long term (current) drug therapy: Secondary | ICD-10-CM | POA: Diagnosis not present

## 2021-09-25 DIAGNOSIS — I10 Essential (primary) hypertension: Secondary | ICD-10-CM | POA: Insufficient documentation

## 2021-09-25 DIAGNOSIS — N401 Enlarged prostate with lower urinary tract symptoms: Secondary | ICD-10-CM | POA: Insufficient documentation

## 2021-09-25 NOTE — Progress Notes (Signed)
NAME: Ernest Hutchinson  DOB: 06-23-1951  MRN: [id number]  Date/Time: 09/25/2021 11:56 AM  Patient here with his wife after recent hospital discharge.  He was admitted to [facility] between 09/12/2021 and 09/15/2021 for ESBL Klebsiella bacteremia and urinary tract infection. ? Ernest Hutchinson is a 70 y.o. with a history of hypertension, hyperlipidemia, hypothyroidism, BPH on finasteride and tamsulosin is here for follow-up.  Patient was discharged on 09/15/2021 on IV or ertapenem because he had an ESBL Klebsiella bacteremia in his blood and urine. CT scan did not show any pyelonephritis.  Concern was that he may have BPH and incomplete bladder emptying. He had a few post void bladder scans and 1 of which had a urine volume of 200 cc after he voided 500 cc..  The rest were not significant. Patient is doing fine He has no fever chills he says has not had any trouble passing urine even though he takes a long time to initiate micturition. His wife is worried that his latest white count is 11.2 from 09/24/2021. She is concerned that the infection is still present His urologist is at [facility] and he made an appointment with the PA for next Monday. Patient will be finishing IV ertapenem on 09/27/2021.  Past Medical History:  Diagnosis Date   Hyperlipemia    Hypertension    Thyroid disease     Past Surgical History:  Procedure Laterality Date   HERNIA REPAIR     REPLACEMENT TOTAL KNEE Left    TONSILLECTOMY      Social History   Socioeconomic History   Marital status: Married    Spouse name: Not on file   Number of children: Not on file   Years of education: Not on file   Highest education level: Not on file  Occupational History   Not on file  Tobacco Use   Smoking status: Never   Smokeless tobacco: Never  Substance and Sexual Activity   Alcohol use: Never   Drug use: Never   Sexual activity: Not on file  Other Topics Concern   Not on file  Social History Narrative   Not on file   Social  Determinants of Health   Financial Resource Strain: Not on file  Food Insecurity: Not on file  Transportation Needs: Not on file  Physical Activity: Not on file  Stress: Not on file  Social Connections: Not on file  Intimate Partner Violence: Not on file    No family history on file. Allergies  Allergen Reactions   Ceftin [Cefuroxime] Anaphylaxis   Cefuroxime Axetil Anaphylaxis   Chlorhexidine Gluconate Itching   I? Current Outpatient Medications  Medication Sig Dispense Refill   atenolol (TENORMIN) 50 MG tablet Take 50 mg by mouth daily. In morning     diazepam (VALIUM) 5 MG tablet Take 5 mg by mouth at bedtime as needed.     ertapenem 1,000 mg in sodium chloride 0.9 % 100 mL Inject 1,000 mg into the vein daily for 11 days.     etodolac (LODINE) 400 MG tablet Take 400 mg by mouth 2 (two) times daily.     finasteride (PROSCAR) 5 MG tablet Take 5 mg by mouth daily. In morning     gabapentin (NEURONTIN) 300 MG capsule Take 300 mg by mouth daily. At bedtime     ibuprofen (ADVIL) 200 MG tablet Take 400 mg by mouth 2 (two) times daily as needed.     lisinopril (ZESTRIL) 5 MG tablet Take 5 mg by mouth 1  day or 1 dose. At bedtime     rosuvastatin (CRESTOR) 20 MG tablet Take 20 mg by mouth daily. At bedtime     tamsulosin (FLOMAX) 0.4 MG CAPS capsule Take 0.4 mg by mouth in the morning and at bedtime.     No current facility-administered medications for this visit.     Abtx:  Anti-infectives (From admission, onward)    None       REVIEW OF SYSTEMS:  Const: negative fever, negative chills, negative weight loss Eyes: negative diplopia or visual changes, negative eye pain ENT: negative coryza, negative sore throat Resp: negative cough, hemoptysis, dyspnea Cards: negative for chest pain, palpitations, lower extremity edema GU: Difficulty initiating micturition.  Negative for frequency, dysuria and hematuria GI: Negative for abdominal pain, diarrhea, bleeding, constipation Skin:  negative for rash and pruritus Heme: negative for easy bruising and gum/nose bleeding MS: negative for myalgias, arthralgias, back pain and muscle weakness Neurolo:negative for headaches, dizziness, vertigo, memory problems  Psych: negative for feelings of anxiety, depression  Endocrine: Has thyroid issues allergy/Immunology-as above: Objective:  VITALS:  BP 109/72   Pulse (!) 102   Resp 16   Ht 5\' 6"  (1.676 m)   Wt 150 lb (68 kg)   SpO2 95%   BMI 24.21 kg/m   PHYSICAL EXAM:  General: Alert, cooperative, no distress, appears stated age.  Head: Normocephalic, without obvious abnormality, atraumatic. Eyes: Conjunctivae clear, anicteric sclerae. Pupils are equal ENT Nares normal. No drainage or sinus tenderness. Lips, mucosa, and tongue normal. No Thrush Neck: Supple, symmetrical, no adenopathy, thyroid: non tender no carotid bruit and no JVD. Back: No CVA tenderness. Lungs: Clear to auscultation bilaterally. No Wheezing or Rhonchi. No rales. Heart: Regular rate and rhythm, no murmur, rub or gallop. Abdomen: Soft, non-tender,not distended. Bowel sounds normal. No masses Extremities: atraumatic, no cyanosis. No edema. No clubbing Skin: No rashes or lesions. Or bruising Lymph: Cervical, supraclavicular normal. Neurologic: Grossly non-focal Right PICC line site clean It was covered with a sock and had multiple tapes pertinent Labs Lab Results CBC 09/24/2021 WBC 11.3, Hb 13.2, platelet 291 Creatinine 1.08  ? Impression/Recommendation ESBL Klebsiella bacteremia with UTI. This is likely due to BPH causing incomplete bladder emptying On tamsulosin and finasteride Ultrasound renal did not show any hydronephrosis Patient was on meropenem in the hospital and discharged on IV or ertapenem for a total of 14 days which will complete on 09/27/2021 He has an appointment with [facility] on 10/01/2021.  Call their office and spoke to the [facility] and appointment has been scheduled for  09/26/2021.  I explained to the wife that leukocytosis of 11.2 is borderline high  we will send a urine culture today.  If there is no ESBL Klebsiella in the urine then we can complete his IV on 09/27/2021 and remove the PICC line.  ? _Hypertension on atenolol  Hyperlipidemia on Crestor  BPH on finasteride and tamsulosin.   Discussed in great detail with the patient and his wife and also communicated with [facility]. Follow-up as needed ____we will inform them once the urine culture is available.  ______________________________________________  Note:  This document was prepared using Dragon voice recognition software and may include unintentional dictation errors.

## 2021-09-25 NOTE — Patient Instructions (Addendum)
You are here for follow up of uti and ESBL klebsiella bacteremia. You will be finishing antibiotics on Wednesday.I would like to get another Urine culture . I called urology at [facility] and made the appt for tomorrow and I also spoke to [facility]

## 2021-09-27 LAB — URINE CULTURE: Culture: <10000 — AB

## 2021-09-28 ENCOUNTER — Encounter: Payer: Self-pay | Admitting: Infectious Diseases

## 2021-09-28 ENCOUNTER — Telehealth: Payer: Self-pay

## 2021-09-28 NOTE — Telephone Encounter (Signed)
Sent PULL PICC orders to [facility]. Patient and I spoke at length. He states he is feeling much better though this has been emotional and very stressful. He has seen urology and they did Ultrasound that revealed his bladder is fully emptying. He was also told he may return to hunting activities once PICC is pulled. I did let him know that the orders were placed and he said [facility] nurse should be coming today. Patient is feeling hopeful and much better at this time and will call us if that changes.

## 2021-09-29 DIAGNOSIS — T84033A Mechanical loosening of internal left knee prosthetic joint, initial encounter: Secondary | ICD-10-CM | POA: Insufficient documentation

## 2021-10-02 ENCOUNTER — Inpatient Hospital Stay: Payer: Medicare Other | Admitting: Infectious Diseases

## 2022-01-13 NOTE — Discharge Instructions (Signed)
Instructions after Total Knee Replacement   Breana Litts P. Juma Oxley, [name], M.D.     Dept. of Orthopaedics & Sports Medicine  [facility]  [address]  [place], [place]  [address]  Phone: [phone]   Fax: [phone]    DIET: Drink plenty of non-alcoholic fluids. Resume your normal diet. Include foods high in fiber.  ACTIVITY:  You may use crutches or a walker with weight-bearing as tolerated, unless instructed otherwise. You may be weaned off of the walker or crutches by your Physical Therapist.  Do NOT place pillows under the knee. Anything placed under the knee could limit your ability to straighten the knee.   Continue doing gentle exercises. Exercising will reduce the pain and swelling, increase motion, and prevent muscle weakness.   Please continue to use the TED compression stockings for 6 weeks. You may remove the stockings at night, but should reapply them in the morning. Do not drive or operate any equipment until instructed.  WOUND CARE:  Continue to use the PolarCare or ice packs periodically to reduce pain and swelling. You may bathe or shower after the staples are removed at the first office visit following surgery.  MEDICATIONS: You may resume your regular medications. Please take the pain medication as prescribed on the medication. Do not take pain medication on an empty stomach. You have been given a prescription for a blood thinner (Lovenox or Coumadin). Please take the medication as instructed. (NOTE: After completing a 2 week course of Lovenox, take one Enteric-coated aspirin once a day. This along with elevation will help reduce the possibility of phlebitis in your operated leg.) Do not drive or drink alcoholic beverages when taking pain medications.  CALL THE OFFICE FOR: Temperature above 101 degrees Excessive bleeding or drainage on the dressing. Excessive swelling, coldness, or paleness of the toes. Persistent nausea and vomiting.  FOLLOW-UP:  You  should have an appointment to return to the office in 10-14 days after surgery. Arrangements have been made for continuation of Physical Therapy (either home therapy or outpatient therapy).   [facility] Department Directory         [email or web address]       [email or web address]          Cardiology  Appointments: [place] - [phone] [place] - [phone]  Endocrinology  Appointments: [place] - [phone] [place] - [phone]  Gastroenterology  Appointments: [place] - [phone] [place] - [phone]        General Surgery   Appointments: [place] - [phone]  Internal Medicine/Family Medicine  Appointments: [place] - [phone] [place] - [phone] [place] - [phone]  Metabolic and Weigh Loss Surgery  Appointments: [place] - [phone]        Neurology  Appointments: [place] - [phone] [place] - [phone]  Neurosurgery  Appointments: [place] - [phone]  Obstetrics & Gynecology  Appointments: [place] - [phone] [place] - [phone]        Pediatrics  Appointments: [place] - [phone] [place] - [phone]  Physiatry  Appointments: [place] -[phone]  Physical Therapy  Appointments: [place] - [phone] [place] - [phone]        Podiatry  Appointments: [place] - [phone] [place] - [phone]  Pulmonology  Appointments: [place] - [phone]  Rheumatology  Appointments: [place] - [phone]        [place] Location: [facility]  [address] , [place]  [address]  [place] Location: [facility] [address] [place], [place]  [address]  [place] Location: [facility] [address] [place], [place]  [address]    

## 2022-01-21 ENCOUNTER — Other Ambulatory Visit: Payer: Medicare Other

## 2022-01-30 HISTORY — PX: TRANSURETHRAL RESECTION OF PROSTATE: SHX73

## 2022-02-04 ENCOUNTER — Encounter: Payer: Self-pay | Admitting: Emergency Medicine

## 2022-02-04 ENCOUNTER — Inpatient Hospital Stay: Admit: 2022-02-04 | Payer: Medicare Other | Admitting: Orthopedic Surgery

## 2022-02-04 ENCOUNTER — Emergency Department
Admission: EM | Admit: 2022-02-04 | Discharge: 2022-02-04 | Disposition: A | Discharge status: Home / Self Care | Payer: Medicare Other | Attending: Emergency Medicine | Admitting: Emergency Medicine

## 2022-02-04 ENCOUNTER — Other Ambulatory Visit: Payer: Self-pay

## 2022-02-04 DIAGNOSIS — R339 Retention of urine, unspecified: Secondary | ICD-10-CM | POA: Insufficient documentation

## 2022-02-04 DIAGNOSIS — Z881 Allergy status to other antibiotic agents status: Secondary | ICD-10-CM

## 2022-02-04 DIAGNOSIS — I10 Essential (primary) hypertension: Secondary | ICD-10-CM | POA: Insufficient documentation

## 2022-02-04 DIAGNOSIS — T84033D Mechanical loosening of internal left knee prosthetic joint, subsequent encounter: Secondary | ICD-10-CM

## 2022-02-04 LAB — URINALYSIS, COMPLETE (UACMP) WITH MICROSCOPIC
Bilirubin Urine: NEGATIVE
Glucose, UA: NEGATIVE mg/dL
Ketones, ur: NEGATIVE mg/dL
Nitrite: NEGATIVE
Protein, ur: NEGATIVE mg/dL
Specific Gravity, Urine: 1.002 — ABNORMAL LOW (ref 1.005–1.030)
pH: 6 (ref 5.0–8.0)

## 2022-02-04 SURGERY — TOTAL KNEE REVISION
Anesthesia: Choice | Site: Knee | Laterality: Left

## 2022-02-04 NOTE — ED Triage Notes (Signed)
Pt states had an operation done at [facility] where they "go up in your penis", reports had foley removed earlier today, reports peed "just a little bit when I got home", pt denies urinary output since then. Pt reports severe lower abd pain and urinary retention.  ?

## 2022-02-04 NOTE — ED Notes (Signed)
Urine sample sent to lab  ?Edt lw  ?

## 2022-02-04 NOTE — Discharge Instructions (Signed)
As we discussed please call your urologist tomorrow to inform them of today's ER visit and the Foley catheter has been reinserted.  Return to the emergency department for any fever any lower abdominal pain or if your Foley catheter fails to output urine. ?

## 2022-02-04 NOTE — ED Provider Notes (Signed)
? ?  [facility] ?Provider Note ? ? ? Event Date/Time  ? First MD Initiated Contact with Patient 02/04/22 1917   ?  (approximate) ? ?History  ? ?Chief Complaint: Urinary Retention ? ?HPI ? ?Ernest Hutchinson is a 71 y.o. male with a past medical history of hypertension, hyperlipidemia, chronic BPH who presents to the emergency department for urinary retention.  According to the patient he had a TURP procedure done and has had a catheter in for the past week or so.  Patient was at urology this morning and they remove the catheter patient was able to urinate at the urologist office however since going home has been unable to urinate.  Patient states he has had increased pain and pressure across the lower abdomen and has been unable to urinate.  Upon arrival patient in discomfort due to lower abdominal distention. ? ?Physical Exam  ? ?Triage Vital Signs: ?ED Triage Vitals  ?Enc Vitals Group  ?   BP 02/04/22 1904 139/78  ?   Pulse Rate 02/04/22 1904 (!) 108  ?   Resp 02/04/22 1904 20  ?   Temp 02/04/22 1904 98 ?F (36.7 ?C)  ?   Temp Source 02/04/22 1904 Oral  ?   SpO2 02/04/22 1904 96 %  ?   Weight 02/04/22 1905 165 lb (74.8 kg)  ?   Height 02/04/22 1905 '5\' 7"'$  (1.702 m)  ?   Head Circumference --   ?   Peak Flow --   ?   Pain Score 02/04/22 1909 10  ?   Pain Loc --   ?   Pain Edu? --   ?   Excl. in [place]? --   ? ? ?Most recent vital signs: ?Vitals:  ? 02/04/22 1904  ?BP: 139/78  ?Pulse: (!) 108  ?Resp: 20  ?Temp: 98 ?F (36.7 ?C)  ?SpO2: 96%  ? ? ?General: Awake, no distress.  ?CV:  Good peripheral perfusion.  Regular rate and rhythm  ?Resp:  Normal effort.  Equal breath sounds bilaterally.  ?Abd:  No distention.  Soft, nontender.  No rebound or guarding.  (Status post catheter nontender) ? ? ?ED Results / Procedures / Treatments  ? ? ?MEDICATIONS ORDERED IN ED: ?Medications - No data to display ? ? ?IMPRESSION / MDM / ASSESSMENT AND PLAN / ED COURSE  ?I reviewed the triage vital signs and the nursing  notes. ? ?Patient presents emergency department after having a Foley catheter removed earlier today at urology.  Patient had a TURP procedure approximately 1 week ago.  States he was able to urinate at the urologist office however since going home he has been unable to urinate.  Upon arrival patient uncomfortable with lower abdominal fullness and a Foley catheter was reinserted.  Patient has put out approximately 1 L of urine.  Patient states the abdominal pain is completely resolved at this time.  Urinalysis sent shows no concern for urinary tract infection.  Urine culture has been sent as well.  Overall patient appears well no distress much improved.  We will place a leg bag on the patient have him follow-up with his urologist this week.  Patient agreeable to plan of care. ? ?FINAL CLINICAL IMPRESSION(S) / ED DIAGNOSES  ? ?Acute urinary retention ? ?Note:  This document was prepared using Dragon voice recognition software and may include unintentional dictation errors. ?  Harvest Dark, MD ?02/04/22 2042 ? ?

## 2022-02-06 LAB — URINE CULTURE: Culture: <10000 — AB

## 2022-03-26 ENCOUNTER — Other Ambulatory Visit
Admission: RE | Admit: 2022-03-26 | Discharge: 2022-03-26 | Disposition: A | Discharge status: Home / Self Care | Payer: Medicare Other | Source: Ambulatory Visit | Attending: Sports Medicine | Admitting: Sports Medicine

## 2022-03-26 DIAGNOSIS — Z96652 Presence of left artificial knee joint: Secondary | ICD-10-CM | POA: Diagnosis present

## 2022-03-26 DIAGNOSIS — T8484XS Pain due to internal orthopedic prosthetic devices, implants and grafts, sequela: Secondary | ICD-10-CM | POA: Insufficient documentation

## 2022-03-26 DIAGNOSIS — T84033D Mechanical loosening of internal left knee prosthetic joint, subsequent encounter: Secondary | ICD-10-CM | POA: Insufficient documentation

## 2022-03-26 DIAGNOSIS — M1712 Unilateral primary osteoarthritis, left knee: Secondary | ICD-10-CM | POA: Diagnosis present

## 2022-03-26 LAB — SYNOVIAL CELL COUNT + DIFF, W/ CRYSTALS
Crystals, Fluid: NONE SEEN
Eosinophils-Synovial: 0 %
Lymphocytes-Synovial Fld: 30 %
Monocyte-Macrophage-Synovial Fluid: 20 %
Neutrophil, Synovial: 50 %
WBC, Synovial: 142 /mm3 (ref 0–200)

## 2022-04-21 NOTE — Discharge Instructions (Signed)
Instructions after Total Knee Replacement   Ernest Hutchinson, [name], M.D.     Dept. of Orthopaedics & Sports Medicine  [facility]  [address]  [place] , [place]  [address]  Phone: [phone]   Fax: [phone]    DIET: Drink plenty of non-alcoholic fluids. Resume your normal diet. Include foods high in fiber.  ACTIVITY:  You may use crutches or a walker with weight-bearing as tolerated, unless instructed otherwise. You may be weaned off of the walker or crutches by your Physical Therapist.  Do NOT place pillows under the knee. Anything placed under the knee could limit your ability to straighten the knee.   Continue doing gentle exercises. Exercising will reduce the pain and swelling, increase motion, and prevent muscle weakness.   Please continue to use the TED compression stockings for 6 weeks. You may remove the stockings at night, but should reapply them in the morning. Do not drive or operate any equipment until instructed.  WOUND CARE:  Continue to use the PolarCare or ice packs periodically to reduce pain and swelling. You may bathe or shower after the staples are removed at the first office visit following surgery.  MEDICATIONS: You may resume your regular medications. Please take the pain medication as prescribed on the medication. Do not take pain medication on an empty stomach. You have been given a prescription for a blood thinner (Lovenox or Coumadin). Please take the medication as instructed. (NOTE: After completing a 2 week course of Lovenox, take one Enteric-coated aspirin once a day. This along with elevation will help reduce the possibility of phlebitis in your operated leg.) Do not drive or drink alcoholic beverages when taking pain medications.  CALL THE OFFICE FOR: Temperature above 101 degrees Excessive bleeding or drainage on the dressing. Excessive swelling, coldness, or paleness of the toes. Persistent nausea and vomiting.  FOLLOW-UP:  You  should have an appointment to return to the office in 10-14 days after surgery. Arrangements have been made for continuation of Physical Therapy (either home therapy or outpatient therapy).   [facility] Department Directory         [email or web address]       [email or web address]          Cardiology  Appointments: [place] - [phone] [place] - [phone]  Endocrinology  Appointments: [place] - [phone] [place] - [phone]  Gastroenterology  Appointments: [place] - [phone] [place] - [phone]        General Surgery   Appointments: [place] - [phone]  Internal Medicine/Family Medicine  Appointments: [place] - [phone] [place] - [phone] [place] - [phone]  Metabolic and Weigh Loss Surgery  Appointments: [place] - [phone]        Neurology  Appointments: [place] - [phone] [place] - [phone]  Neurosurgery  Appointments: [place] - [phone]  Obstetrics & Gynecology  Appointments: [place] - [phone] [place] - [phone]        Pediatrics  Appointments: [place] - [phone] [place] - [phone]  Physiatry  Appointments: [place] [phone]  Physical Therapy  Appointments: [place] - [phone] [place] - [phone]        Podiatry  Appointments: [place] - [phone] [place] - [phone]  Pulmonology  Appointments: [place] - [phone]  Rheumatology  Appointments: [place] - [phone]        [place] Location: [facility]  [address] , [place]  [address]  [place] Location: [facility] [address] [place], [place]  [address]  [place] Location: [facility] [address] [place], [place]  [address]    

## 2022-04-22 ENCOUNTER — Encounter
Admission: RE | Admit: 2022-04-22 | Discharge: 2022-04-22 | Disposition: A | Discharge status: Home / Self Care | Payer: Medicare Other | Source: Ambulatory Visit | Attending: Orthopedic Surgery | Admitting: Orthopedic Surgery

## 2022-04-22 VITALS — BP 107/68 | HR 76 | Ht 67.0 in | Wt 168.9 lb

## 2022-04-22 DIAGNOSIS — T84033D Mechanical loosening of internal left knee prosthetic joint, subsequent encounter: Secondary | ICD-10-CM | POA: Diagnosis not present

## 2022-04-22 DIAGNOSIS — N390 Urinary tract infection, site not specified: Secondary | ICD-10-CM | POA: Insufficient documentation

## 2022-04-22 DIAGNOSIS — Z881 Allergy status to other antibiotic agents status: Secondary | ICD-10-CM | POA: Diagnosis not present

## 2022-04-22 DIAGNOSIS — Z01812 Encounter for preprocedural laboratory examination: Secondary | ICD-10-CM | POA: Diagnosis present

## 2022-04-22 DIAGNOSIS — Y838 Other surgical procedures as the cause of abnormal reaction of the patient, or of later complication, without mention of misadventure at the time of the procedure: Secondary | ICD-10-CM | POA: Diagnosis not present

## 2022-04-22 DIAGNOSIS — R8290 Unspecified abnormal findings in urine: Secondary | ICD-10-CM | POA: Insufficient documentation

## 2022-04-22 DIAGNOSIS — Y798 Miscellaneous orthopedic devices associated with adverse incidents, not elsewhere classified: Secondary | ICD-10-CM | POA: Insufficient documentation

## 2022-04-22 DIAGNOSIS — Z01818 Encounter for other preprocedural examination: Secondary | ICD-10-CM

## 2022-04-22 HISTORY — DX: Other specified bacterial intestinal infections: A04.8

## 2022-04-22 HISTORY — DX: COVID-19: U07.1

## 2022-04-22 HISTORY — DX: Benign prostatic hyperplasia without lower urinary tract symptoms: N40.0

## 2022-04-22 HISTORY — DX: Unspecified osteoarthritis, unspecified site: M19.90

## 2022-04-22 HISTORY — DX: Nontoxic single thyroid nodule: E04.1

## 2022-04-22 HISTORY — DX: Personal history of urinary calculi: Z87.442

## 2022-04-22 HISTORY — DX: Deficiency of other specified B group vitamins: E53.8

## 2022-04-22 HISTORY — DX: Thyrotoxicosis, unspecified without thyrotoxic crisis or storm: E05.90

## 2022-04-22 HISTORY — DX: Diverticulitis of intestine, part unspecified, without perforation or abscess without bleeding: K57.92

## 2022-04-22 HISTORY — DX: Gastro-esophageal reflux disease without esophagitis: K21.9

## 2022-04-22 LAB — URINALYSIS, ROUTINE W REFLEX MICROSCOPIC
Bilirubin Urine: NEGATIVE
Glucose, UA: NEGATIVE mg/dL
Hgb urine dipstick: NEGATIVE
Ketones, ur: NEGATIVE mg/dL
Nitrite: NEGATIVE
Protein, ur: NEGATIVE mg/dL
Specific Gravity, Urine: 1.013 (ref 1.005–1.030)
Squamous Epithelial / HPF: NONE SEEN (ref 0–5)
WBC, UA: >50 WBC/hpf — ABNORMAL HIGH (ref 0–5)
pH: 5 (ref 5.0–8.0)

## 2022-04-22 LAB — TYPE AND SCREEN
ABO/RH(D): B POS
Antibody Screen: NEGATIVE

## 2022-04-22 LAB — SEDIMENTATION RATE: Sed Rate: 3 mm/hr (ref 0–20)

## 2022-04-22 LAB — SURGICAL PCR SCREEN
MRSA, PCR: NEGATIVE
Staphylococcus aureus: NEGATIVE

## 2022-04-22 LAB — C-REACTIVE PROTEIN: CRP: 0.6 mg/dL (ref <1.0)

## 2022-04-22 NOTE — Patient Instructions (Addendum)
Your procedure is scheduled on:05-01-22 Wednesday Report to the Registration Desk on the 1st floor of the [facility].Then proceed to the 2nd floor Surgery Desk in the [facility] To find out your arrival time, please call [phone] between 1PM - 3PM on:04-30-22 Tuesday If your arrival time is 6:00 am, do not arrive prior to that time as the [facility] entrance doors do not open until 6:00 am.  REMEMBER: Instructions that are not followed completely may result in serious medical risk, up to and including death; or upon the discretion of your surgeon and anesthesiologist your surgery may need to be rescheduled.  Do not eat food after midnight the night before surgery.  No gum chewing, lozengers or hard candies.  You may however, drink CLEAR liquids up to 2 hours before you are scheduled to arrive for your surgery. Do not drink anything within 2 hours of your scheduled arrival time.  Clear liquids include: - water  - apple juice without pulp - gatorade (not RED colors) - black coffee or tea (Do NOT add milk or creamers to the coffee or tea) Do NOT drink anything that is not on this list.  In addition, your doctor has ordered for you to drink the provided  Ensure Pre-Surgery Clear Carbohydrate Drink Drinking this carbohydrate drink up to two hours before surgery helps to reduce insulin resistance and improve patient outcomes. Please complete drinking 2 hours prior to scheduled arrival time.  TAKE THESE MEDICATIONS THE MORNING OF SURGERY WITH A SIP OF WATER: -atenolol (TENORMIN) -finasteride (PROSCAR)  -tamsulosin (FLOMAX) -gabapentin (NEURONTIN)  One week prior to surgery:Last dose on 04-23-22 Stop Anti-inflammatories (NSAIDS) such as Advil, Aleve, Ibuprofen, Motrin, Naproxen, Naprosyn and Aspirin based products such as Excedrin, Goodys Powder, BC Powder.You may however, take Tylenol if needed for pain up until the day of surgery.  Stop ANY OVER THE COUNTER supplements/vitamins 7  days prior to surgery  No Alcohol for 24 hours before or after surgery.  No Smoking including e-cigarettes for 24 hours prior to surgery.  No chewable tobacco products for at least 6 hours prior to surgery.  No nicotine patches on the day of surgery.  Do not use any "recreational" drugs for at least a week prior to your surgery.  Please be advised that the combination of cocaine and anesthesia may have negative outcomes, up to and including death. If you test positive for cocaine, your surgery will be cancelled.  On the morning of surgery brush your teeth with toothpaste and water, you may rinse your mouth with mouthwash if you wish. Do not swallow any toothpaste or mouthwash.  Do not wear jewelry, make-up, hairpins, clips or nail polish.  Do not wear lotions, powders, or perfumes.   Do not shave body from the neck down 48 hours prior to surgery just in case you cut yourself which could leave a site for infection.  Also, freshly shaved skin may become irritated if using the CHG soap.  Contact lenses, hearing aids and dentures may not be worn into surgery.  Do not bring valuables to the hospital. [facility] is not responsible for any missing/lost belongings or valuables.    Notify your doctor if there is any change in your medical condition (cold, fever, infection).  Wear comfortable clothing (specific to your surgery type) to the hospital.  After surgery, you can help prevent lung complications by doing breathing exercises.  Take deep breaths and cough every 1-2 hours. Your doctor may order a device called  an Chiropodist to help you take deep breaths. When coughing or sneezing, hold a pillow firmly against your incision with both hands. This is called "splinting." Doing this helps protect your incision. It also decreases belly discomfort.  If you are being admitted to the hospital overnight, leave your suitcase in the car. After surgery it may be brought to your  room.  If you are being discharged the day of surgery, you will not be allowed to drive home. You will need a responsible adult (18 years or older) to drive you home and stay with you that night.   If you are taking public transportation, you will need to have a responsible adult (18 years or older) with you. Please confirm with your physician that it is acceptable to use public transportation.   Please call the [facility] Dept. at [phone] if you have any questions about these instructions.  Surgery Visitation Policy:  Patients undergoing a surgery or procedure may have two family members or support persons with them as long as the person is not COVID-19 positive or experiencing its symptoms.   Inpatient Visitation:    Visiting hours are 7 a.m. to 8 p.m. Up to four visitors are allowed at one time in a patient room, including children. The visitors may rotate out with other people during the day. One designated support person (adult) may remain overnight.

## 2022-04-23 NOTE — Progress Notes (Signed)
  [facility] Perioperative Services: Pre-Admission/Anesthesia Testing  Abnormal Lab Notification   Date: 04/23/22  Name: Ernest Hutchinson MRN:   [id number]  Re: Abnormal labs noted during PAT appointment   Notified:  Provider Name Provider Role Notification Mode  Skip Estimable, MD Orthopedics (Surgeon) Routed and/or faxed via [facility]   Abnormal Lab Value(s):   Lab Results  Component Value Date   COLORURINE YELLOW (A) 04/22/2022   APPEARANCEUR CLOUDY (A) 04/22/2022   LABSPEC 1.013 04/22/2022   PHURINE 5.0 04/22/2022   GLUCOSEU NEGATIVE 04/22/2022   HGBUR NEGATIVE 04/22/2022   BILIRUBINUR NEGATIVE 04/22/2022   KETONESUR NEGATIVE 04/22/2022   PROTEINUR NEGATIVE 04/22/2022   NITRITE NEGATIVE 04/22/2022   LEUKOCYTESUR LARGE (A) 04/22/2022   EPIU NONE SEEN 04/22/2022   WBCU >50 (H) 04/22/2022   RBCU 6-10 04/22/2022   BACTERIA MANY (A) 04/22/2022   CULT (A) 04/22/2022    >=100,000 COLONIES/mL KLEBSIELLA PNEUMONIAE SUSCEPTIBILITIES TO FOLLOW Performed at [facility], [place] [address] [place], [place] [address]    Clinical Information and Notes:  Patient is scheduled for TOTAL KNEE REVISION (Left: Knee) on 05/01/2022.    UA performed in PAT consistent with/concerning for infection.  No leukocytosis noted on CBC; WBC 7200 Urine C&S added to assess for pathogenically significant growth.  Impression and Plan:  Ernest Hutchinson with a UA that was (+) for infection; reflex culture sent. Preliminary culture (+) for significant ESBL producing Klebsiella pneumoniae colony count; final susceptibilities pending. He has been treated twice already with ertapenem for urosepsis + bacteremia related to the aforementioned MDRO. Patient under the care of ID at [facility]; last visit 03/12/2022.   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 I am unable to view any colonoscopy results from [facility] at this time.   ID recommended synovial fluid  sampling from the knee prior to surgery to rule out associated infection prior to joint replacement. Synovial sampling as been performed, however I cannot view the results.   DDx is ASB due to bacterial colonization vs. recurrent urinary tract infection secondary to CV fistula vs. chronic prostatitis. Unsure is patient experiencing symptoms at this time as I have not seen this patient. Will defer symptom determination to surgeon. 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 Will plan on forwarding final culture results to MD as they become available to me. Sending to surgeon for review to determine need further ID input and prophylactic treatment vs. proceeding as planned with surgery as deemed appropriate by Dr. Marry Guan.   [name], MSN, APRN, FNP-C, CEN [facility]  Peri-operative Services Nurse Practitioner Phone: [phone] Fax: [phone] 04/23/22 2:24 PM  NOTE: This note has been prepared using Dragon dictation software. Despite my best ability to proofread, there is always the potential that unintentional transcriptional errors may still occur from this process.

## 2022-04-24 LAB — URINE CULTURE: Culture: >=100000 — AB

## 2022-04-25 LAB — IGE: IgE (Immunoglobulin E), Serum: 267 IU/mL (ref 6–495)

## 2022-04-28 NOTE — H&P (Signed)
ORTHOPAEDIC HISTORY & PHYSICAL Ernest Hutchinson, [place] - 04/25/2022 1:30 PM EDT Formatting of this note is different from the original. [place] [facility] Chief Complaint:   Chief Complaint  Patient presents with  Knee Pain  H & P LEFT KNEE   History of Present Illness:   Ernest Hutchinson is a 71 y.o. male that presents to clinic today for his preoperative history and evaluation. Patient presents with his wife. The patient is scheduled to undergo a revision left total knee arthroplasty on 05/01/22 by Dr. Marry Guan. Patient initially underwent left total knee arthroplasty on 11/30/2019 with Dr. Foster Simpson. Despite actively participating in postoperative physical therapy patient suffered from persistent left knee pain and swelling. Subsequent x-rays and bone scan confirmed loosening of the femoral component of the prosthesis. Pain is worsened with weightbearing activities. He denies associated numbness or tingling.   The patient's symptoms have progressed to the point that they decrease his quality of life. The patient has previously undergone conservative treatment including NSAIDS and injections to the knee without adequate control of his symptoms.  Denies history of lumbar surgery, history of DVT. Is followed by cardiology as well as urology. Also of note, he was admitted to the hospital from 11/23 to 11/26 for ESBL Klebsiella bacteremia and urinary tract infection. He has been treated with IV ertapenem. He has been cleared by urology for his upcoming surgery.   Reports previous allergy to ceftin, states he can't breathe.   Past Medical, Surgical, Family, Social History, Allergies, Medications:   Past Medical History:  Past Medical History:  Diagnosis Date  Carpal tunnel syndrome  Diverticulitis of colon (without mention of hemorrhage)(562.11)  GERD (gastroesophageal reflux disease)  H. pylori infection 09/01/2014  tx 11/15  H/O partial  thyroidectomy 09/25/2012  Hypercholesterolemia 02/14/2012  Hypertension 02/14/2012  Nephrolithiasis  Odynophagia, unspecified  Osteoarthritis 8-10 years ago  Preoperative evaluation to rule out surgical contraindication 01/22/2022  Thyroid nodule 09/25/2012  Transient alteration of awareness  After knee surgery in 2021  Wears glasses   Past Surgical History:  Past Surgical History:  Procedure Laterality Date  TONSILLECTOMY 1960  COLONOSCOPY 02/2011  no adenomatous polyps  EXCISION HYDROCELE UNILATERAL Left 12/15/2013  Procedure: EXCISION HYDROCELE UNILATERAL; Surgeon: Alroy Dust, MD; Location: [place]; Service: Urology; Laterality: Left;  ESOPHAGOGASTRODOUDENOSCOPY W/BIOPSY N/A 08/15/2014  Procedure: EGD; Surgeon: Reola Mosher, MD; Location: [facility]; Service: Gastroenterology; Laterality: N/A;  HERNIA REPAIR Bilateral 10/2018  Bilateral Inguinal hernia repair  ARTHROPLASTY TOTAL KNEE Left 11/30/2019  Procedure: ARTHROPLASTY, KNEE, CONDYLE AND PLATEAU; MEDIAL AND LATERAL COMPARTMENTSWITH OR WITHOUT PATELLA RESURFACING (TOTAL KNEE ARTHROPLASTY); Surgeon: Mardi Mainland, MD; Location: [place]; Service: Orthopedics; Laterality: Left;  Left ARTHROPLASTY, KNEE, CONDYLE AND PLATEAU; MEDIAL AND LATERAL COMPARTMENTSWITH OR WITHOUT PATELLA RESURFACING (TOTAL KNEE ARTHROPLASTY) 11/30/2019  Dr Rodney Langton  EXCISION HYDROCELE UNILATERAL Right 07/03/2020  Procedure: EXCISION OF HYDROCELE; UNILATERAL; Surgeon: Alroy Dust, MD; Location: [place]; Service: Urology; Laterality: Right;  RESECTION TRANSURETHRAL PROSTATE N/A 01/30/2022  Procedure: TRANSURETHRAL ELECTROSURGICAL RESECTION PROSTATE, INCL CONTROL OF POSTOPERATIVE BLEEDING, COMPLETE (VASECTOMY, MEATOTOMY,CYSTOURETHROSCOPY, URETHRAL CALIBRATION AND/OR DILATION, INTERNAL URETHROTOMY); Surgeon: Alroy Dust, MD; Location: [place]; Service: Urology; Laterality: N/A;  carpel  tunnel release Right  h/o partial thyroidetomy   Current Medications:  Current Outpatient Medications  Medication Sig Dispense Refill  acetaminophen (TYLENOL) 500 MG tablet Take 500 mg by mouth every 6 (six) hours as needed for Pain  atenoloL (TENORMIN) 50 MG tablet  Take 1 tablet (50 mg total) by mouth once daily for 90 days 90 tablet 3  cholecalciferol, vitamin D3, (VITAMIN D3) 125 mcg (5,000 unit) tablet Take 5,000 Units by mouth once daily  cranberry 500 mg Cap Take 1 capsule by mouth 2 (two) times daily  cyanocobalamin (VITAMIN B12) 1000 MCG tablet Take 1,000 mcg by mouth once daily  diazePAM (VALIUM) 5 MG tablet TAKE 1 TO 1 AND 1/2 TABLETS (5-7.5 MG TOTAL) BY MOUTH NIGHTLY AS NEEDED 45 tablet 5  finasteride (PROSCAR) 5 mg tablet Take 1 tablet (5 mg total) by mouth once daily 90 tablet 3  gabapentin (NEURONTIN) 300 MG capsule TAKE 1 CAPSULE BY MOUTH TWICE A DAY 60 capsule 3  ibuprofen (MOTRIN) 200 MG tablet Take 400 mg by mouth every 6 (six) hours as needed for Pain  lisinopriL (ZESTRIL) 5 MG tablet Take 1 tablet (5 mg total) by mouth every evening 90 tablet 3  propylene glycoL (SYSTANE COMPLETE) 0.6 % ophthalmic drops Place 1 drop into both eyes as needed for Dry Eyes  rosuvastatin (CRESTOR) 20 MG tablet Take 1 tablet (20 mg total) by mouth once daily 90 tablet 3  tamsulosin (FLOMAX) 0.4 mg capsule TAKE 1 CAPSULE (0.4 MG TOTAL) BY MOUTH 2 (TWO) TIMES DAILY TAKE 30 MINUTES AFTER SAME MEAL EACH DAY. 180 capsule 3   No current facility-administered medications for this visit.   Allergies:  Allergies  Allergen Reactions  Ceftin [Cefuroxime Axetil] Anaphylaxis and Rash  Sunburn like rash  Chlorhexidine Gluconate Itching   Social History:  Social History   Socioeconomic History  Marital status: Married  Spouse name: Thayer Headings  Number of children: 0  Years of education: 12  Occupational History  Occupation: Part-time -Financial controller, Cytogeneticist  Comment: Does work for [employer]   Tobacco Use  Smoking status: Former  Packs/day: 1.00  Years: 10.00  Pack years: 10.00  Types: Cigarettes  Quit date: 09/09/1985  Years since quitting: 36.6  Smokeless tobacco: Former  Types: Chew  Quit date: 12/29/2001  Tobacco comments:  Used Chewing Tobacco about 1 year  Vaping Use  Vaping Use: Never used  Substance and Sexual Activity  Alcohol use: Not Currently  Comment: Quit 40 yrs ago  Drug use: No  Sexual activity: Not Currently  Partners: Female  Social History Narrative  Lives w/ wife. 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 Admits that job is stressful   Dispensing optician Insecurity: No Food Insecurity  Worried About Charity fundraiser in the Last Year: Never true  Arboriculturist in the Last Year: Never true  Transportation Needs: No Civil engineer, contracting (Medical): No  Lack of Transportation (Non-Medical): No   Family History:  Family History  Problem Relation Age of Onset  Stroke Mother  High blood pressure (Hypertension) Mother  Myocardial Infarction (Heart attack) Father 24  High blood pressure (Hypertension) Father  Thyroid disease Maternal Grandmother  High blood pressure (Hypertension) Sister  Myocardial Infarction (Heart attack) Paternal Grandfather 63  Anesthesia problems Neg Hx  Malignant hypertension Neg Hx  Malignant hyperthermia Neg Hx  Pseudochol deficiency Neg Hx   Review of Systems:   A 10+ ROS was performed, reviewed, and the pertinent orthopaedic findings are documented in the HPI.   Physical Examination:   BP 110/60 (BP Location: Left upper arm, Patient Position: Sitting, BP Cuff Size: Adult)  Ht 170.2 cm ('5\' 7"'$ )  Wt 76.6 kg (168 lb 12.8 oz)  BMI 26.44 kg/m   Patient is a well-developed, well-nourished male in no acute distress. Patient has normal mood and affect. Patient is alert and oriented to person, place, and time.   HEENT: Atraumatic, normocephalic. Pupils equal and  reactive to light. Extraocular motion intact. Noninjected sclera.  Cardiovascular: Regular rate and rhythm, with no murmurs, rubs, or gallops. Distal pulses palpable.  Respiratory: Lungs clear to auscultation bilaterally.   Left Knee: Soft tissue swelling: moderate, well-healed surgical incision noted Effusion: none Erythema: none Crepitance: minimal Tenderness: global Alignment: normal Mediolateral laxity: stable Patellar tracking: Good tracking without evidence of subluxation or tilt Atrophy: No significant atrophy. Quadriceps tone was fair to good. Range of motion: 0/10/100 degrees  Sensation intact over the saphenous, lateral sural cutaneous, superficial fibular, and deep fibular nerve distributions.  Tests Performed/Reviewed:  X-rays  Anteroposterior, lateral, and sunrise views of the left knee were obtained. Images reveal a left total knee arthroplasty. Gross lucency noted around the femoral component. No periprosthetic lucency noted around the tibial component.  I personally ordered and interpreted today's x-rays.  3-phase bone scan: I reviewed the 3-phase bone scan performed at [facility] on 09/06/2021. I concur with the radiologist's interpretation as below:  NUCLEAR MEDICINE 3-PHASE BONE SCAN   TECHNIQUE:  Radionuclide angiographic images, immediate static blood pool  images, and 3-hour delayed static images were obtained of the after  intravenous injection of radiopharmaceutical.   RADIOPHARMACEUTICALS: 22.26 mCi Tc-23mMDP IV   COMPARISON: None.   FINDINGS:  Vascular phase: Asymmetric increased blood flow to the left knee  compared to the right knee.   Blood pool phase: Asymmetric uptake around the left knee.   Delayed phase: Abnormal areas of increased uptake around the femoral  component more notably medially.   IMPRESSION:  Bone scan findings suggest synovitis and loosening of the femoral  prosthesis.   Electronically Signed   By: PMarijo SanesM.D.  On: 09/06/2021 16:19  Impression:   ICD-10-CM  1. 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 Having failed conservative treatment, the patient has elected to proceed with a revision total joint arthroplasty. The patient will undergo a revision total joint arthroplasty with Dr. HMarry Guan The risks of surgery, including blood clot and infection, were discussed with the patient. Measures to reduce these risks, including the use of anticoagulation, perioperative antibiotics, and early ambulation were discussed. The importance of postoperative physical therapy was discussed with the patient. The patient elects to proceed with surgery. The patient is instructed to stop all blood thinners prior to surgery. The patient is instructed to call the hospital the day before surgery to learn of the proper arrival time.   Contact our office with any questions or concerns. Follow up as indicated, or sooner should any new problems arise, if conditions worsen, or if they are otherwise concerned.   BGwenlyn Hutchinson [facility] [address] [place] [address]Phone: [phone] This note was generated in part with voice recognition software and I apologize for any typographical errors that were not detected and corrected.  Electronically signed by SGwenlyn Fudge PA at 04/26/2022 6:04 PM EDT

## 2022-05-01 ENCOUNTER — Inpatient Hospital Stay: Payer: Medicare Other | Admitting: Urgent Care

## 2022-05-01 ENCOUNTER — Encounter
Admission: RE | Disposition: A | Discharge status: Home Health | Payer: Self-pay | Source: Home / Self Care | Attending: Orthopedic Surgery

## 2022-05-01 ENCOUNTER — Other Ambulatory Visit: Payer: Self-pay

## 2022-05-01 ENCOUNTER — Inpatient Hospital Stay
Admission: RE | Admit: 2022-05-01 | Discharge: 2022-05-02 | DRG: 468 | Disposition: A | Discharge status: Home Health | Payer: Medicare Other | Attending: Orthopedic Surgery | Admitting: Orthopedic Surgery

## 2022-05-01 ENCOUNTER — Inpatient Hospital Stay: Payer: Medicare Other

## 2022-05-01 ENCOUNTER — Encounter: Payer: Self-pay | Admitting: Orthopedic Surgery

## 2022-05-01 DIAGNOSIS — Z881 Allergy status to other antibiotic agents status: Secondary | ICD-10-CM

## 2022-05-01 DIAGNOSIS — Z8249 Family history of ischemic heart disease and other diseases of the circulatory system: Secondary | ICD-10-CM

## 2022-05-01 DIAGNOSIS — E538 Deficiency of other specified B group vitamins: Secondary | ICD-10-CM | POA: Diagnosis present

## 2022-05-01 DIAGNOSIS — Z96659 Presence of unspecified artificial knee joint: Secondary | ICD-10-CM

## 2022-05-01 DIAGNOSIS — T84033A Mechanical loosening of internal left knee prosthetic joint, initial encounter: Principal | ICD-10-CM | POA: Diagnosis present

## 2022-05-01 DIAGNOSIS — Z823 Family history of stroke: Secondary | ICD-10-CM | POA: Diagnosis not present

## 2022-05-01 DIAGNOSIS — E7800 Pure hypercholesterolemia, unspecified: Secondary | ICD-10-CM | POA: Diagnosis present

## 2022-05-01 DIAGNOSIS — T84033D Mechanical loosening of internal left knee prosthetic joint, subsequent encounter: Principal | ICD-10-CM

## 2022-05-01 DIAGNOSIS — N390 Urinary tract infection, site not specified: Secondary | ICD-10-CM

## 2022-05-01 DIAGNOSIS — Y831 Surgical operation with implant of artificial internal device as the cause of abnormal reaction of the patient, or of later complication, without mention of misadventure at the time of the procedure: Secondary | ICD-10-CM | POA: Diagnosis present

## 2022-05-01 DIAGNOSIS — Z79899 Other long term (current) drug therapy: Secondary | ICD-10-CM | POA: Diagnosis not present

## 2022-05-01 DIAGNOSIS — N400 Enlarged prostate without lower urinary tract symptoms: Secondary | ICD-10-CM | POA: Diagnosis present

## 2022-05-01 DIAGNOSIS — K219 Gastro-esophageal reflux disease without esophagitis: Secondary | ICD-10-CM | POA: Diagnosis present

## 2022-05-01 DIAGNOSIS — I10 Essential (primary) hypertension: Secondary | ICD-10-CM | POA: Diagnosis present

## 2022-05-01 HISTORY — PX: TOTAL KNEE REVISION: SHX996

## 2022-05-01 LAB — ABO/RH: ABO/RH(D): B POS

## 2022-05-01 SURGERY — TOTAL KNEE REVISION
Anesthesia: General | Site: Knee | Laterality: Left

## 2022-05-01 MED ORDER — HYDROMORPHONE HCL 1 MG/ML IJ SOLN: 0.5000 mg | INTRAMUSCULAR | Status: DC | PRN

## 2022-05-01 MED ORDER — SUCCINYLCHOLINE CHLORIDE 200 MG/10ML IV SOSY
PREFILLED_SYRINGE | INTRAVENOUS | Status: DC | PRN
  Administered 2022-05-01: 120 mg via INTRAVENOUS

## 2022-05-01 MED ORDER — HYDROMORPHONE HCL 1 MG/ML IJ SOLN
INTRAMUSCULAR | Status: AC
  Filled 2022-05-01: qty 1

## 2022-05-01 MED ORDER — ACETAMINOPHEN 10 MG/ML IV SOLN
INTRAVENOUS | Status: DC | PRN
  Administered 2022-05-01: 1000 mg via INTRAVENOUS

## 2022-05-01 MED ORDER — SODIUM CHLORIDE 0.9 % IV SOLN: INTRAVENOUS | Status: DC

## 2022-05-01 MED ORDER — GABAPENTIN 300 MG PO CAPS
ORAL_CAPSULE | ORAL | Status: AC
  Filled 2022-05-01: qty 1

## 2022-05-01 MED ORDER — CELECOXIB 200 MG PO CAPS: 400.0000 mg | ORAL_CAPSULE | Freq: Once | ORAL | Status: AC

## 2022-05-01 MED ORDER — LIDOCAINE HCL (CARDIAC) PF 100 MG/5ML IV SOSY
PREFILLED_SYRINGE | INTRAVENOUS | Status: DC | PRN
  Administered 2022-05-01: 80 mg via INTRAVENOUS

## 2022-05-01 MED ORDER — ROCURONIUM BROMIDE 100 MG/10ML IV SOLN
INTRAVENOUS | Status: DC | PRN
  Administered 2022-05-01: 10 mg via INTRAVENOUS
  Administered 2022-05-01: 40 mg via INTRAVENOUS
  Administered 2022-05-01 (×2): 20 mg via INTRAVENOUS

## 2022-05-01 MED ORDER — ACETAMINOPHEN 325 MG PO TABS: 325.0000 mg | ORAL_TABLET | Freq: Four times a day (QID) | ORAL | Status: DC | PRN

## 2022-05-01 MED ORDER — FENTANYL CITRATE (PF) 250 MCG/5ML IJ SOLN
INTRAMUSCULAR | Status: AC
  Filled 2022-05-01: qty 5

## 2022-05-01 MED ORDER — TAMSULOSIN HCL 0.4 MG PO CAPS
0.4000 mg | ORAL_CAPSULE | Freq: Two times a day (BID) | ORAL | Status: DC
  Administered 2022-05-02: 0.4 mg via ORAL
  Filled 2022-05-01: qty 1

## 2022-05-01 MED ORDER — HYDROMORPHONE HCL 1 MG/ML IJ SOLN
INTRAMUSCULAR | Status: DC | PRN
  Administered 2022-05-01: .25 mg via INTRAVENOUS
  Administered 2022-05-01: .5 mg via INTRAVENOUS
  Administered 2022-05-01: .25 mg via INTRAVENOUS

## 2022-05-01 MED ORDER — DIPHENHYDRAMINE HCL 12.5 MG/5ML PO ELIX: 12.5000 mg | ORAL_SOLUTION | ORAL | Status: DC | PRN

## 2022-05-01 MED ORDER — PHENYLEPHRINE HCL-NACL 20-0.9 MG/250ML-% IV SOLN
INTRAVENOUS | Status: AC
Start: 2022-05-01 — End: ?
  Filled 2022-05-01: qty 250

## 2022-05-01 MED ORDER — CELECOXIB 200 MG PO CAPS
ORAL_CAPSULE | ORAL | Status: AC
  Administered 2022-05-01: 200 mg via ORAL
  Filled 2022-05-01: qty 1

## 2022-05-01 MED ORDER — LACTATED RINGERS IV SOLN: INTRAVENOUS | Status: DC

## 2022-05-01 MED ORDER — ATENOLOL 25 MG PO TABS
50.0000 mg | ORAL_TABLET | Freq: Every day | ORAL | Status: DC
  Administered 2022-05-02: 50 mg via ORAL
  Filled 2022-05-01: qty 2

## 2022-05-01 MED ORDER — BUPIVACAINE HCL (PF) 0.25 % IJ SOLN
INTRAMUSCULAR | Status: AC
Start: 2022-05-01 — End: ?
  Filled 2022-05-01: qty 60

## 2022-05-01 MED ORDER — CEFAZOLIN SODIUM-DEXTROSE 2-4 GM/100ML-% IV SOLN
2.0000 g | Freq: Four times a day (QID) | INTRAVENOUS | Status: AC
  Administered 2022-05-01: 2 g via INTRAVENOUS
  Filled 2022-05-01: qty 100

## 2022-05-01 MED ORDER — VASOPRESSIN 20 UNIT/ML IV SOLN
INTRAVENOUS | Status: DC | PRN
  Administered 2022-05-01 (×2): 1 [IU] via INTRAVENOUS

## 2022-05-01 MED ORDER — BISACODYL 10 MG RE SUPP: 10.0000 mg | Freq: Every day | RECTAL | Status: DC | PRN

## 2022-05-01 MED ORDER — DEXAMETHASONE SODIUM PHOSPHATE 10 MG/ML IJ SOLN
INTRAMUSCULAR | Status: AC
  Administered 2022-05-01: 8 mg via INTRAVENOUS
  Filled 2022-05-01: qty 1

## 2022-05-01 MED ORDER — ONDANSETRON HCL 4 MG/2ML IJ SOLN
INTRAMUSCULAR | Status: DC | PRN
  Administered 2022-05-01: 4 mg via INTRAVENOUS

## 2022-05-01 MED ORDER — ONDANSETRON HCL 4 MG/2ML IJ SOLN: 4.0000 mg | Freq: Four times a day (QID) | INTRAMUSCULAR | Status: DC | PRN

## 2022-05-01 MED ORDER — VITAMIN B-12 1000 MCG PO TABS
1000.0000 ug | ORAL_TABLET | Freq: Every day | ORAL | Status: DC
  Administered 2022-05-02: 1000 ug via ORAL
  Filled 2022-05-01: qty 1

## 2022-05-01 MED ORDER — SURGIPHOR WOUND IRRIGATION SYSTEM - OPTIME: TOPICAL | Status: DC | PRN

## 2022-05-01 MED ORDER — CEFAZOLIN SODIUM-DEXTROSE 2-4 GM/100ML-% IV SOLN
2.0000 g | INTRAVENOUS | Status: AC
Start: 2022-05-01 — End: 2022-05-01
  Administered 2022-05-01: 2 g via INTRAVENOUS

## 2022-05-01 MED ORDER — PHENYLEPHRINE 80 MCG/ML (10ML) SYRINGE FOR IV PUSH (FOR BLOOD PRESSURE SUPPORT)
PREFILLED_SYRINGE | INTRAVENOUS | Status: AC
  Filled 2022-05-01: qty 10

## 2022-05-01 MED ORDER — PROPOFOL 10 MG/ML IV BOLUS
INTRAVENOUS | Status: AC
  Filled 2022-05-01: qty 20

## 2022-05-01 MED ORDER — FENTANYL CITRATE (PF) 100 MCG/2ML IJ SOLN
INTRAMUSCULAR | Status: DC | PRN
  Administered 2022-05-01: 25 ug via INTRAVENOUS
  Administered 2022-05-01 (×3): 50 ug via INTRAVENOUS
  Administered 2022-05-01: 25 ug via INTRAVENOUS
  Administered 2022-05-01: 50 ug via INTRAVENOUS

## 2022-05-01 MED ORDER — CEFAZOLIN SODIUM-DEXTROSE 2-4 GM/100ML-% IV SOLN
INTRAVENOUS | Status: AC
  Filled 2022-05-01: qty 100

## 2022-05-01 MED ORDER — SUCCINYLCHOLINE CHLORIDE 200 MG/10ML IV SOSY
PREFILLED_SYRINGE | INTRAVENOUS | Status: AC
  Filled 2022-05-01: qty 10

## 2022-05-01 MED ORDER — SUGAMMADEX SODIUM 200 MG/2ML IV SOLN
INTRAVENOUS | Status: DC | PRN
  Administered 2022-05-01: 150 mg via INTRAVENOUS

## 2022-05-01 MED ORDER — PANTOPRAZOLE SODIUM 40 MG PO TBEC
40.0000 mg | DELAYED_RELEASE_TABLET | Freq: Two times a day (BID) | ORAL | Status: DC
  Administered 2022-05-01 – 2022-05-02 (×2): 40 mg via ORAL
  Filled 2022-05-01 (×2): qty 1

## 2022-05-01 MED ORDER — PHENYLEPHRINE HCL-NACL 20-0.9 MG/250ML-% IV SOLN
INTRAVENOUS | Status: DC | PRN
  Administered 2022-05-01: 40 ug/min via INTRAVENOUS

## 2022-05-01 MED ORDER — ESMOLOL HCL 100 MG/10ML IV SOLN
INTRAVENOUS | Status: AC
  Filled 2022-05-01: qty 10

## 2022-05-01 MED ORDER — EPHEDRINE 5 MG/ML INJ
INTRAVENOUS | Status: AC
  Filled 2022-05-01: qty 5

## 2022-05-01 MED ORDER — EPHEDRINE SULFATE (PRESSORS) 50 MG/ML IJ SOLN
INTRAMUSCULAR | Status: DC | PRN
  Administered 2022-05-01: 10 mg via INTRAVENOUS
  Administered 2022-05-01 (×3): 5 mg via INTRAVENOUS

## 2022-05-01 MED ORDER — CHLORHEXIDINE GLUCONATE 4 % EX LIQD: 60.0000 mL | Freq: Once | CUTANEOUS | Status: DC

## 2022-05-01 MED ORDER — DEXAMETHASONE SODIUM PHOSPHATE 10 MG/ML IJ SOLN: 8.0000 mg | Freq: Once | INTRAMUSCULAR | Status: AC

## 2022-05-01 MED ORDER — PROPOFOL 10 MG/ML IV BOLUS
INTRAVENOUS | Status: DC | PRN
  Administered 2022-05-01: 150 mg via INTRAVENOUS

## 2022-05-01 MED ORDER — TRANEXAMIC ACID-NACL 1000-0.7 MG/100ML-% IV SOLN
INTRAVENOUS | Status: AC
  Administered 2022-05-01: 1000 mg via INTRAVENOUS
  Filled 2022-05-01: qty 100

## 2022-05-01 MED ORDER — BUPIVACAINE LIPOSOME 1.3 % IJ SUSP
INTRAMUSCULAR | Status: AC
  Filled 2022-05-01: qty 20

## 2022-05-01 MED ORDER — FERROUS SULFATE 325 (65 FE) MG PO TABS
325.0000 mg | ORAL_TABLET | Freq: Two times a day (BID) | ORAL | Status: DC
  Administered 2022-05-02: 325 mg via ORAL
  Filled 2022-05-01: qty 1

## 2022-05-01 MED ORDER — PHENYLEPHRINE HCL (PRESSORS) 10 MG/ML IV SOLN
INTRAVENOUS | Status: DC | PRN
  Administered 2022-05-01: 160 ug via INTRAVENOUS
  Administered 2022-05-01 (×7): 80 ug via INTRAVENOUS
  Administered 2022-05-01: 160 ug via INTRAVENOUS
  Administered 2022-05-01: 80 ug via INTRAVENOUS
  Administered 2022-05-01: 160 ug via INTRAVENOUS

## 2022-05-01 MED ORDER — OXYCODONE HCL 5 MG/5ML PO SOLN: 5.0000 mg | Freq: Once | ORAL | Status: DC | PRN

## 2022-05-01 MED ORDER — CELECOXIB 200 MG PO CAPS
ORAL_CAPSULE | ORAL | Status: AC
  Administered 2022-05-01: 400 mg via ORAL
  Filled 2022-05-01: qty 2

## 2022-05-01 MED ORDER — FLEET ENEMA 7-19 GM/118ML RE ENEM
1.0000 | ENEMA | Freq: Once | RECTAL | Status: DC | PRN
Start: 2022-05-01 — End: 2022-05-02

## 2022-05-01 MED ORDER — TRANEXAMIC ACID-NACL 1000-0.7 MG/100ML-% IV SOLN
INTRAVENOUS | Status: AC
  Filled 2022-05-01: qty 100

## 2022-05-01 MED ORDER — METOCLOPRAMIDE HCL 5 MG PO TABS
10.0000 mg | ORAL_TABLET | Freq: Three times a day (TID) | ORAL | Status: DC
  Administered 2022-05-01 – 2022-05-02 (×2): 10 mg via ORAL
  Filled 2022-05-01 (×3): qty 2

## 2022-05-01 MED ORDER — SODIUM CHLORIDE FLUSH 0.9 % IV SOLN
INTRAVENOUS | Status: AC
  Filled 2022-05-01: qty 40

## 2022-05-01 MED ORDER — 0.9 % SODIUM CHLORIDE (POUR BTL) OPTIME
TOPICAL | Status: DC | PRN
  Administered 2022-05-01: 500 mL

## 2022-05-01 MED ORDER — SODIUM CHLORIDE 0.9 % IR SOLN
Status: DC | PRN
  Administered 2022-05-01: 3000 mL

## 2022-05-01 MED ORDER — FAMOTIDINE 20 MG PO TABS
ORAL_TABLET | ORAL | Status: AC
  Administered 2022-05-01: 20 mg via ORAL
  Filled 2022-05-01: qty 1

## 2022-05-01 MED ORDER — GABAPENTIN 300 MG PO CAPS
300.0000 mg | ORAL_CAPSULE | Freq: Two times a day (BID) | ORAL | Status: DC
  Administered 2022-05-01 – 2022-05-02 (×2): 300 mg via ORAL
  Filled 2022-05-01 (×2): qty 1

## 2022-05-01 MED ORDER — DIAZEPAM 5 MG PO TABS: 5.0000 mg | ORAL_TABLET | Freq: Every evening | ORAL | Status: DC | PRN

## 2022-05-01 MED ORDER — VASOPRESSIN 20 UNIT/ML IV SOLN
INTRAVENOUS | Status: AC
  Filled 2022-05-01: qty 1

## 2022-05-01 MED ORDER — ACETAMINOPHEN 10 MG/ML IV SOLN
INTRAVENOUS | Status: AC
  Filled 2022-05-01: qty 100

## 2022-05-01 MED ORDER — DEXMEDETOMIDINE HCL IN NACL 80 MCG/20ML IV SOLN
INTRAVENOUS | Status: AC
  Filled 2022-05-01: qty 20

## 2022-05-01 MED ORDER — OXYCODONE HCL 5 MG PO TABS: 5.0000 mg | ORAL_TABLET | Freq: Once | ORAL | Status: DC | PRN

## 2022-05-01 MED ORDER — ORAL CARE MOUTH RINSE: 15.0000 mL | Freq: Once | OROMUCOSAL | Status: DC

## 2022-05-01 MED ORDER — ONDANSETRON HCL 4 MG/2ML IJ SOLN
INTRAMUSCULAR | Status: AC
  Filled 2022-05-01: qty 2

## 2022-05-01 MED ORDER — DEXMEDETOMIDINE (PRECEDEX) IN NS 20 MCG/5ML (4 MCG/ML) IV SYRINGE
PREFILLED_SYRINGE | INTRAVENOUS | Status: DC | PRN
  Administered 2022-05-01 (×3): 8 ug via INTRAVENOUS
  Administered 2022-05-01: 4 ug via INTRAVENOUS

## 2022-05-01 MED ORDER — MENTHOL 3 MG MT LOZG: 1.0000 | LOZENGE | OROMUCOSAL | Status: DC | PRN

## 2022-05-01 MED ORDER — POLYVINYL ALCOHOL 1.4 % OP SOLN: 1.0000 [drp] | OPHTHALMIC | Status: DC | PRN

## 2022-05-01 MED ORDER — LISINOPRIL 5 MG PO TABS
5.0000 mg | ORAL_TABLET | Freq: Every day | ORAL | Status: DC
  Administered 2022-05-02: 5 mg via ORAL
  Filled 2022-05-01: qty 1

## 2022-05-01 MED ORDER — ENSURE PRE-SURGERY PO LIQD
296.0000 mL | Freq: Once | ORAL | Status: AC
  Administered 2022-05-01: 296 mL via ORAL
  Filled 2022-05-01: qty 296

## 2022-05-01 MED ORDER — TRANEXAMIC ACID-NACL 1000-0.7 MG/100ML-% IV SOLN: 1000.0000 mg | Freq: Once | INTRAVENOUS | Status: AC

## 2022-05-01 MED ORDER — FENTANYL CITRATE (PF) 100 MCG/2ML IJ SOLN
25.0000 ug | INTRAMUSCULAR | Status: DC | PRN
  Administered 2022-05-01: 25 ug via INTRAVENOUS

## 2022-05-01 MED ORDER — CELECOXIB 200 MG PO CAPS
200.0000 mg | ORAL_CAPSULE | Freq: Two times a day (BID) | ORAL | Status: DC
  Administered 2022-05-02: 200 mg via ORAL
  Filled 2022-05-01: qty 1

## 2022-05-01 MED ORDER — PHENOL 1.4 % MT LIQD: 1.0000 | OROMUCOSAL | Status: DC | PRN

## 2022-05-01 MED ORDER — VITAMIN D 25 MCG (1000 UNIT) PO TABS
5000.0000 [IU] | ORAL_TABLET | Freq: Every day | ORAL | Status: DC
Start: 2022-05-02 — End: 2022-05-02
  Administered 2022-05-02: 5000 [IU] via ORAL
  Filled 2022-05-01: qty 5

## 2022-05-01 MED ORDER — FENTANYL CITRATE (PF) 100 MCG/2ML IJ SOLN
INTRAMUSCULAR | Status: AC
  Filled 2022-05-01: qty 2

## 2022-05-01 MED ORDER — ALUM & MAG HYDROXIDE-SIMETH 200-200-20 MG/5ML PO SUSP: 30.0000 mL | ORAL | Status: DC | PRN

## 2022-05-01 MED ORDER — MIDAZOLAM HCL 2 MG/2ML IJ SOLN
INTRAMUSCULAR | Status: DC | PRN
  Administered 2022-05-01: 2 mg via INTRAVENOUS

## 2022-05-01 MED ORDER — FINASTERIDE 5 MG PO TABS
5.0000 mg | ORAL_TABLET | Freq: Every day | ORAL | Status: DC
  Administered 2022-05-02: 5 mg via ORAL
  Filled 2022-05-01: qty 1

## 2022-05-01 MED ORDER — MAGNESIUM HYDROXIDE 400 MG/5ML PO SUSP
30.0000 mL | Freq: Every day | ORAL | Status: DC
  Administered 2022-05-01 – 2022-05-02 (×2): 30 mL via ORAL
  Filled 2022-05-01 (×2): qty 30

## 2022-05-01 MED ORDER — GABAPENTIN 300 MG PO CAPS: 300.0000 mg | ORAL_CAPSULE | Freq: Once | ORAL | Status: DC

## 2022-05-01 MED ORDER — ROSUVASTATIN CALCIUM 10 MG PO TABS
20.0000 mg | ORAL_TABLET | Freq: Every day | ORAL | Status: DC
  Administered 2022-05-02: 20 mg via ORAL
  Filled 2022-05-01: qty 2

## 2022-05-01 MED ORDER — ACETAMINOPHEN 10 MG/ML IV SOLN
1000.0000 mg | Freq: Four times a day (QID) | INTRAVENOUS | Status: DC
  Administered 2022-05-01 – 2022-05-02 (×3): 1000 mg via INTRAVENOUS
  Filled 2022-05-01 (×4): qty 100

## 2022-05-01 MED ORDER — TRANEXAMIC ACID-NACL 1000-0.7 MG/100ML-% IV SOLN
1000.0000 mg | INTRAVENOUS | Status: AC
  Administered 2022-05-01: 1000 mg via INTRAVENOUS

## 2022-05-01 MED ORDER — FAMOTIDINE 20 MG PO TABS: 20.0000 mg | ORAL_TABLET | Freq: Once | ORAL | Status: AC

## 2022-05-01 MED ORDER — MIDAZOLAM HCL 2 MG/2ML IJ SOLN
INTRAMUSCULAR | Status: AC
  Filled 2022-05-01: qty 2

## 2022-05-01 MED ORDER — OXYCODONE HCL 5 MG PO TABS: 5.0000 mg | ORAL_TABLET | ORAL | Status: DC | PRN

## 2022-05-01 MED ORDER — SENNOSIDES-DOCUSATE SODIUM 8.6-50 MG PO TABS
1.0000 | ORAL_TABLET | Freq: Two times a day (BID) | ORAL | Status: DC
  Administered 2022-05-01 – 2022-05-02 (×2): 1 via ORAL
  Filled 2022-05-01 (×2): qty 1

## 2022-05-01 MED ORDER — TRAMADOL HCL 50 MG PO TABS
50.0000 mg | ORAL_TABLET | ORAL | Status: DC | PRN
  Filled 2022-05-01: qty 1

## 2022-05-01 MED ORDER — CEFAZOLIN SODIUM-DEXTROSE 2-4 GM/100ML-% IV SOLN
INTRAVENOUS | Status: AC
  Administered 2022-05-01: 2 g via INTRAVENOUS
  Filled 2022-05-01: qty 100

## 2022-05-01 MED ORDER — OXYCODONE HCL 5 MG PO TABS: 10.0000 mg | ORAL_TABLET | ORAL | Status: DC | PRN

## 2022-05-01 MED ORDER — SODIUM CHLORIDE (PF) 0.9 % IJ SOLN
INTRAMUSCULAR | Status: DC | PRN
  Administered 2022-05-01: 120 mL via INTRAMUSCULAR

## 2022-05-01 MED ORDER — ENOXAPARIN SODIUM 30 MG/0.3ML IJ SOSY
30.0000 mg | PREFILLED_SYRINGE | Freq: Two times a day (BID) | INTRAMUSCULAR | Status: DC
  Administered 2022-05-02: 30 mg via SUBCUTANEOUS
  Filled 2022-05-01: qty 0.3

## 2022-05-01 MED ORDER — ONDANSETRON HCL 4 MG PO TABS: 4.0000 mg | ORAL_TABLET | Freq: Four times a day (QID) | ORAL | Status: DC | PRN

## 2022-05-01 SURGICAL SUPPLY — 99 items
AUGMENT POST FEM SZ5 4 KNEE (Miscellaneous) ×2 IMPLANT
BLADE CLIPPER SURG (BLADE) ×1 IMPLANT
BLADE OSCILLATING/SAGITTAL (BLADE) ×1
BLADE SAGITTAL 25.0X1.19X90 (BLADE) ×2 IMPLANT
BLADE SAW 70X12.5 (BLADE) ×2 IMPLANT
BLADE SAW 90X13X1.19 OSCILLAT (BLADE) ×2 IMPLANT
BLADE SAW 90X25X1.19 OSCILLAT (BLADE) ×2 IMPLANT
BLADE SAW SGTL 13X75X1.27 (BLADE) ×2 IMPLANT
BLADE SW THK.38XMED LNG THN (BLADE) ×1 IMPLANT
BONE CEMENT GENTAMICIN (Cement) ×4 IMPLANT
CEMENT BONE GENTAMICIN 40 (Cement) ×2 IMPLANT
CNTNR SPEC 2.5X3XGRAD LEK (MISCELLANEOUS)
COMP FEM ATTUNE CRS SZ5 LT (Femur) ×2 IMPLANT
COMPONENT FEM ATN CRS SZ5 LT (Femur) IMPLANT
CONT SPEC 4OZ STER OR WHT (MISCELLANEOUS)
CONTAINER SPEC 2.5X3XGRAD LEK (MISCELLANEOUS) ×4 IMPLANT
COOLER POLAR GLACIER W/PUMP (MISCELLANEOUS) ×2 IMPLANT
COVER BACK TABLE REUSABLE LG (DRAPES) ×2 IMPLANT
CUFF TOURN SGL QUICK 24 (TOURNIQUET CUFF)
CUFF TOURN SGL QUICK 34 (TOURNIQUET CUFF)
CUFF TRNQT CYL 24X4X16.5-23 (TOURNIQUET CUFF) IMPLANT
CUFF TRNQT CYL 34X4.125X (TOURNIQUET CUFF) IMPLANT
DRAPE 3/4 80X56 (DRAPES) ×4 IMPLANT
DRSG DERMACEA NONADH 3X8 (GAUZE/BANDAGES/DRESSINGS) ×2 IMPLANT
DRSG MEPILEX SACRM 8.7X9.8 (GAUZE/BANDAGES/DRESSINGS) ×1 IMPLANT
DRSG OPSITE POSTOP 4X14 (GAUZE/BANDAGES/DRESSINGS) ×2 IMPLANT
DRSG TEGADERM 4X4.75 (GAUZE/BANDAGES/DRESSINGS) ×2 IMPLANT
DURAPREP 26ML APPLICATOR (WOUND CARE) ×4 IMPLANT
ELECT CAUTERY BLADE 6.4 (BLADE) ×2 IMPLANT
ELECT REM PT RETURN 9FT ADLT (ELECTROSURGICAL) ×2
ELECTRODE REM PT RTRN 9FT ADLT (ELECTROSURGICAL) ×1 IMPLANT
GAUZE SPONGE 4X4 12PLY STRL (GAUZE/BANDAGES/DRESSINGS) ×2 IMPLANT
GLOVE BIOGEL M STRL SZ7.5 (GLOVE) ×10 IMPLANT
GLOVE BIOGEL PI IND STRL 8 (GLOVE) ×1 IMPLANT
GLOVE BIOGEL PI INDICATOR 8 (GLOVE) ×1
GLOVE BIOGEL PI ORTHO PRO 7.5 (GLOVE) ×3
GLOVE PI ORTHO PRO STRL 7.5 (GLOVE) IMPLANT
GLOVE SURG UNDER LTX SZ8 (GLOVE) ×2 IMPLANT
GLOVE SURG UNDER POLY LF SZ7.5 (GLOVE) ×2 IMPLANT
GOWN STRL REUS W/ TWL LRG LVL3 (GOWN DISPOSABLE) ×2 IMPLANT
GOWN STRL REUS W/TWL LRG LVL3 (GOWN DISPOSABLE) ×2
HANDPIECE VERSAJET DEBRIDEMENT (MISCELLANEOUS) ×1 IMPLANT
HEMOVAC 400CC 10FR (MISCELLANEOUS) ×2 IMPLANT
HOLDER FOLEY CATH W/STRAP (MISCELLANEOUS) ×2 IMPLANT
HOOD PEEL AWAY FLYTE STAYCOOL (MISCELLANEOUS) ×4 IMPLANT
INSERT TIB CMT ATTUNE RP SZ4 (Knees) ×1 IMPLANT
INSERT TIB CRS ATTUNE SZ5 8 (Insert) ×1 IMPLANT
IRRIGATION SURGIPHOR STRL (IV SOLUTION) ×1 IMPLANT
IV NS IRRIG 3000ML ARTHROMATIC (IV SOLUTION) ×2 IMPLANT
KIT TURNOVER KIT A (KITS) ×2 IMPLANT
KNIFE SCULPS 14X20 (INSTRUMENTS) ×2 IMPLANT
MANIFOLD NEPTUNE II (INSTRUMENTS) ×4 IMPLANT
NDL SAFETY ECLIPSE 18X1.5 (NEEDLE) ×1 IMPLANT
NDL SPNL 18GX3.5 QUINCKE PK (NEEDLE) ×1 IMPLANT
NDL SPNL 20GX3.5 QUINCKE YW (NEEDLE) ×2 IMPLANT
NEEDLE HYPO 18GX1.5 SHARP (NEEDLE)
NEEDLE SPNL 18GX3.5 QUINCKE PK (NEEDLE) ×2 IMPLANT
NEEDLE SPNL 20GX3.5 QUINCKE YW (NEEDLE) ×4 IMPLANT
NS IRRIG 1000ML POUR BTL (IV SOLUTION) ×1 IMPLANT
NS IRRIG 500ML POUR BTL (IV SOLUTION) ×1 IMPLANT
OSTEOTOME THIN 10.0 1.5 (INSTRUMENTS) ×1 IMPLANT
OSTEOTOME THIN 6.0 1.5 (INSTRUMENTS) ×1 IMPLANT
OSTEOTOME THIN 6.0 3 (INSTRUMENTS) ×1 IMPLANT
PACK TOTAL KNEE (MISCELLANEOUS) ×2 IMPLANT
PAD ABD DERMACEA PRESS 5X9 (GAUZE/BANDAGES/DRESSINGS) ×3 IMPLANT
PAD WRAPON POLAR KNEE (MISCELLANEOUS) ×1 IMPLANT
PENCIL SMOKE EVACUATOR COATED (MISCELLANEOUS) ×1 IMPLANT
PIN FIXATION 1/8DIA X 3INL (PIN) ×1 IMPLANT
PULSAVAC PLUS IRRIG FAN TIP (DISPOSABLE) ×2
SLEEVE FEM POROCOAT 30 (Knees) ×1 IMPLANT
SLEEVE KNEE ATTUNE 29MM (Knees) ×1 IMPLANT
SOL PREP PVP 2OZ (MISCELLANEOUS) ×2
SOLUTION IRRIG SURGIPHOR (IV SOLUTION) ×1 IMPLANT
SOLUTION PREP PVP 2OZ (MISCELLANEOUS) ×1 IMPLANT
SPONGE DRAIN TRACH 4X4 STRL 2S (GAUZE/BANDAGES/DRESSINGS) ×2 IMPLANT
SPONGE T-LAP 18X18 ~~LOC~~+RFID (SPONGE) ×3 IMPLANT
STAPLER SKIN PROX 35W (STAPLE) ×2 IMPLANT
STEM STR ATTUNE PF 12X160 (Knees) ×1 IMPLANT
STEM STR ATTUNE PF 16X60 (Knees) ×1 IMPLANT
SUCTION FRAZIER HANDLE 10FR (MISCELLANEOUS) ×2
SUCTION TUBE FRAZIER 10FR DISP (MISCELLANEOUS) ×1 IMPLANT
SUT MNCRL 0 1X36 CT-1 (SUTURE) ×1 IMPLANT
SUT MON AB 2-0 CT1 36 (SUTURE) ×1 IMPLANT
SUT MONOCRYL 0 (SUTURE)
SUT PROLENE 1 CT 1 30 (SUTURE) ×1 IMPLANT
SUT VIC AB 0 CT1 36 (SUTURE) ×3 IMPLANT
SUT VIC AB 1 CT1 36 (SUTURE) ×4 IMPLANT
SUT VIC AB 2-0 CT1 (SUTURE) ×2 IMPLANT
SWAB CULTURE AMIES ANAERIB BLU (MISCELLANEOUS) ×8 IMPLANT
SYR 20ML LL LF (SYRINGE) ×1 IMPLANT
SYR 30ML LL (SYRINGE) ×4 IMPLANT
TIP BRUSH PULSAVAC PLUS 24.33 (MISCELLANEOUS) ×2 IMPLANT
TIP FAN IRRIG PULSAVAC PLUS (DISPOSABLE) ×1 IMPLANT
TOWEL OR 17X26 4PK STRL BLUE (TOWEL DISPOSABLE) ×1 IMPLANT
TOWER CARTRIDGE SMART MIX (DISPOSABLE) ×2 IMPLANT
TRAY FOLEY MTR SLVR 16FR STAT (SET/KITS/TRAYS/PACK) ×2 IMPLANT
WATER STERILE IRR 1000ML POUR (IV SOLUTION) ×2 IMPLANT
WATER STERILE IRR 500ML POUR (IV SOLUTION) ×1 IMPLANT
WRAPON POLAR PAD KNEE (MISCELLANEOUS) ×2

## 2022-05-01 NOTE — Anesthesia Postprocedure Evaluation (Signed)
Anesthesia Post Note  Patient: Ernest Hutchinson  Procedure(s) Performed: TOTAL KNEE REVISION (Left: Knee)  Patient location during evaluation: PACU Anesthesia Type: General Level of consciousness: awake and alert Pain management: pain level controlled Vital Signs Assessment: post-procedure vital signs reviewed and stable Respiratory status: spontaneous breathing, nonlabored ventilation, respiratory function stable and patient connected to nasal cannula oxygen Cardiovascular status: blood pressure returned to baseline and stable Postop Assessment: no apparent nausea or vomiting Anesthetic complications: no   No notable events documented.   Last Vitals:  Vitals:   05/01/22 1715 05/01/22 1748  BP: 117/72 123/76  Pulse: 99 96  Resp: 14   Temp: 36.4 C (!) 36.4 C  SpO2: 100% 99%    Last Pain:  Vitals:   05/01/22 1600  TempSrc:   PainSc: Asleep                 Precious Haws Gregorey Nabor

## 2022-05-01 NOTE — Anesthesia Procedure Notes (Signed)
Procedure Name: Intubation Date/Time: 05/01/2022 8:08 AM  Performed by: Jerrye Noble, CRNAPre-anesthesia Checklist: Patient identified, Emergency Drugs available, Suction available and Patient being monitored Patient Re-evaluated:Patient Re-evaluated prior to induction Oxygen Delivery Method: Circle system utilized Preoxygenation: Pre-oxygenation with 100% oxygen Induction Type: IV induction Ventilation: Mask ventilation without difficulty and Oral airway inserted - appropriate to patient size Laryngoscope Size: McGraph and 3 Grade View: Grade I Tube type: Oral Tube size: 7.0 mm Number of attempts: 1 Airway Equipment and Method: Stylet, Oral airway and Video-laryngoscopy Placement Confirmation: ETT inserted through vocal cords under direct vision, positive ETCO2 and breath sounds checked- equal and bilateral Secured at: 22 cm Tube secured with: Tape Dental Injury: Teeth and Oropharynx as per pre-operative assessment

## 2022-05-01 NOTE — H&P (Signed)
The patient has been re-examined, and the chart reviewed, and there have been no interval changes to the documented history and physical.    The risks, benefits, and alternatives have been discussed at length. The patient expressed understanding of the risks benefits and agreed with plans for surgical intervention.  Shaquisha Wynn P. Charistopher Rumble, [name] M.D.    

## 2022-05-01 NOTE — Op Note (Signed)
OPERATIVE NOTE  DATE OF SURGERY:  05/01/2022  PATIENT NAME:  LAWARENCE MEEK   DOB: 08/02/1951  MRN: [id number]  PRE-OPERATIVE DIAGNOSIS: Loose implants status post left total knee arthroplasty  POST-OPERATIVE DIAGNOSIS:  Same  PROCEDURE:  Left total knee revision arthroplasty (femoral and tibial components)  SURGEON:  Marciano Sequin. M.D.  ASSISTANT: Cassell Smiles, PA-C (present and scrubbed throughout the case, critical for assistance with exposure, retraction, instrumentation, and closure)  ANESTHESIA: general  ESTIMATED BLOOD LOSS: 100 mL  FLUIDS REPLACED: 2700 mL of crystalloid  TOURNIQUET TIME: #1 - 120 minutes #2 - 53 minutes  DRAINS: 2 medium Hemovac drains  IMPLANTS UTILIZED: DePuy Attune Revision CRS size 5 femoral component, 4 mm posterior augments (medial and lateral), 30 mm fully porous-coated femoral sleeve, 16 mm x 60 mm press-fit stem, size 4 revision rotating platform tibial component, 29 mm fully porous-coated tibial sleeve, 12 mm x 60 mm press-fit stem, and an 8 mm revision CRS stabilized rotating platform polyethylene insert.  INDICATIONS FOR SURGERY: GOODWIN KAMPHAUS is a 71 y.o. year old male who previously underwent a left total knee arthroplasty at [facility].  He had progressive knee pain postoperatively. X-rays demonstrated findings suggestive of femoral implant loosening.  Bone scan confirmed evidence of implant loosening. The patient had not seen any significant improvement despite conservative nonsurgical intervention. After discussion of the risks and benefits of surgical intervention, the patient expressed understanding of the risks benefits and agree with plans for total knee revision arthroplasty.   The risks, benefits, and alternatives were discussed at length including but not limited to the risks of infection, bleeding, nerve injury, stiffness, blood clots, the need for revision surgery, cardiopulmonary complications, among others, and they were willing to  proceed.  PROCEDURE IN DETAIL: The patient was brought into the operating room and, after adequate general anesthesia was achieved, a tourniquet was placed on the patient's upper thigh. The patient's knee and leg were cleaned and prepped with alcohol and DuraPrep and draped in the usual sterile fashion. A "timeout" was performed as per usual protocol. The lower extremity was exsanguinated using an Esmarch, and the tourniquet was inflated to 300 mmHg. An anterior longitudinal incision was made in line with the previous surgical scar followed by a standard medial parapatellar approach. The deep fibers of the medial collateral ligament were elevated in a subperiosteal fashion off of the medial flare of the tibia so as to maintain a continuous soft tissue sleeve. The patella was subluxed laterally and the patellofemoral ligament was incised.  The medial and lateral gutters as well as the suprapatellar pouch was reestablished by excising the fibrotic scar tissue.  The setscrew for the polyethylene insert was removed and the polyethylene insert was removed without difficulty.  Soft tissue around the borders of the tibial and femoral implants was debrided using combination of electrocautery, curettes, and rongeurs.  Thin flexible osteotomes were used to disrupt the interface between the femoral implant and the cement.  The femoral component was felt to be grossly loose and was carefully removed without significant bone loss.  Some residual cement was removed from the underlying bone.  Next, the flexible thin osteotomes were used to disrupt the cement implant interface along the undersurface of the tibial component.  A TPS saw was also used to disrupt his interface.  Back cutting osteotomes were also used for disruption of the interface along the posterior aspect of the tibial component.  There was some difficulty with extricating the tibial component  due to the multiple pegs.  Broad osteotomes were stacked so as to  gently elevate the tray.  The tray was subsequently removed with some bone loss centrally.  Tourniquet was deflated after initial tourniquet time 120 minutes.  The tibial and femoral canals were reamed in sequential fashion up to a 12 mm diameter on the tibial canal and up to a 16 mm diameter on the femoral canal.  The intramedullary proximal tibial cutting guide was attached to the intramedullary reamer and a cleanup cut was performed.  Conical reamer was used followed by insertion of a 29 mm tibial broach.  The proximal tibia was sized and was felt that a size 4 tibial tray was appropriate.  Attention was then directed to the femur.  The distal femoral cutting guide was placed on the intramedullary reamer and a distal cleanup cut was performed.  Conical reamer was used to open the canal and a 30 mm broach with a 16 x 60 mm stem was impacted.  Conventional cutting guide was placed on the broach and the anterior and posterior cuts were performed.  Posterior cuts were performed so as to allow for 4 mm augments both medially and laterally.  Chamfer cuts were subsequently performed.  The box cut was subsequently performed.  Size 5 femoral trial with the 30 mm broach and 16 x 60 mm stem was then positioned and impacted into place.  First a 6 and subsequently an 8 mm polyethylene trial was placed.  This allowed for full extension and excellent flexion.  The patellar component was visualized and noted to be in excellent condition without evidence of wear or loosening.  Good patellar tracking was appreciated.  The left lower extremity was exsanguinated second time and the tourniquet inflated to 300 mmHg.  The trial components were removed.  Cut surfaces of bone were irrigated with copious amounts of normal saline, solution using pulsatile lavage and suctioned dry.  Femoral component was constructed on the back table consisting of a size 5 revision CRS femoral component with 4 mm posterior augments medially and  laterally, a 30 mm fully porous-coated femoral sleeve, and a 16 mm x 60 mm press-fit stem.  The tibial component was also constructed on the back table using a size 4 revision rotating platform tibial component, 29 mm fully porous-coated tibial sleeve, and a 12 mm x 60 mm press-fit stem. Polymethylmethacrylate cement with gentamicin was prepared in the usual fashion using a vacuum mixer. Cement was applied to the cut surface of the proximal tibia as well as along the undersurface of a size 4 revision tibial component. Tibial component was positioned and impacted into place. Excess cement was removed using [employer] fast streamer. Cement was then applied to the cut surfaces of the femur as well as along the posterior flanges of the size 5 revision femoral component. The femoral component was positioned and impacted into place. Excess cement was removed using [employer] fast streamer. An 8 mm polyethylene trial was inserted and the knee was brought into full extension with steady axial compression applied. After adequate curing of the cement, the tourniquet was deflated after a second tourniquet time of 53 minutes. Hemostasis was achieved using electrocautery. The knee was irrigated with copious amounts of normal saline using pulsatile lavage followed by 450 ml of Surgiphor and then suctioned dry. 20 mL of 1.3% Exparel and 60 mL of 0.25% Marcaine in 40 mL of normal saline was injected along the posterior capsule, medial and lateral gutters, and along the arthrotomy site.  An 8 mm revision CRS rotating platform polyethylene insert was inserted and the knee was placed through a range of motion with excellent mediolateral soft tissue balancing appreciated and excellent patellar tracking noted. 2 medium drains were placed in the wound bed and brought out through separate stab incisions. The medial parapatellar portion of the incision was reapproximated using interrupted sutures of #1 Vicryl. Subcutaneous tissue was approximated in layers  using first #0 Vicryl followed #2-0 Vicryl. The skin was approximated with skin staples. A sterile dressing was applied.  The patient tolerated the procedure well and was transported to the recovery room in stable condition.    Elan Brainerd P. Holley Bouche., M.D.

## 2022-05-01 NOTE — TOC Progression Note (Signed)
Transition of Care [facility]) - Progression Note    Patient Details  Name: Ernest Hutchinson MRN: [id number] Date of Birth: 1951/07/23  Transition of Care [facility]) CM/SW [place], RN Phone Number: 05/01/2022, 2:22 PM  Clinical Narrative:     Patient was accepted by [facility] for [facility] services        Expected Discharge Plan and Services                                                 Social Determinants of Health (SDOH) Interventions    Readmission Risk Interventions     No data to display

## 2022-05-01 NOTE — Progress Notes (Signed)
PT Cancellation Note  Patient Details Name: Ernest Hutchinson MRN: [id number] DOB: Oct 08, 1951   Cancelled Treatment:    Reason Eval/Treat Not Completed: Patient's level of consciousness;Fatigue/lethargy limiting ability to participate Called to PACU, pt with long surgery and still confused and not at all appropriate for PT at this time.  Will attempt PT eval tomorrow as appropriate.     Kreg Shropshire, DPT 05/01/2022, 4:59 PM

## 2022-05-01 NOTE — Progress Notes (Signed)
Pt remains confused.  Speech has cleared some.  Alert at this time. Has remained hemodynamically stable.  Dr piscitello at bedside and ok for pt to go to room.

## 2022-05-01 NOTE — Progress Notes (Signed)
Pt arrived in room from PACU. Pt A+Ox2, thought it was April 2022 and he came to the hospital to get this stuff of his leg. Mateo Flow RN stated that was much better than the responses he originally gave to her in post op. Pt is resting comfortably. Stated he would like some ice cream, denies pain at this time. Will continue to monitor.

## 2022-05-01 NOTE — Transfer of Care (Signed)
Immediate Anesthesia Transfer of Care Note  Patient: Ernest Hutchinson  Procedure(s) Performed: TOTAL KNEE REVISION (Left: Knee)  Patient Location: PACU  Anesthesia Type:General  Level of Consciousness: awake, drowsy and patient cooperative  Airway & Oxygen Therapy: Patient Spontanous Breathing and Patient connected to face mask oxygen  Post-op Assessment: Report given to RN and Post -op Vital signs reviewed and stable  Post vital signs: Reviewed and stable  Last Vitals:  Vitals Value Taken Time  BP 128/77 05/01/22 1535  Temp    Pulse 106 05/01/22 1539  Resp 18 05/01/22 1539  SpO2 99 % 05/01/22 1539  Vitals shown include unvalidated device data.  Last Pain:  Vitals:   05/01/22 0633  TempSrc: Tympanic  PainSc: 3          Complications: No notable events documented.

## 2022-05-01 NOTE — Anesthesia Preprocedure Evaluation (Signed)
Anesthesia Evaluation  Patient identified by MRN, date of birth, ID band Patient awake    Reviewed: Allergy & Precautions, NPO status , Patient's Chart, lab work & pertinent test results  History of Anesthesia Complications (+) Emergence Delirium and history of anesthetic complications  Airway Mallampati: IV  TM Distance: >3 FB Neck ROM: full    Dental  (+) Chipped   Pulmonary neg pulmonary ROS, former smoker,    Pulmonary exam normal        Cardiovascular hypertension, negative cardio ROS Normal cardiovascular exam     Neuro/Psych negative neurological ROS  negative psych ROS   GI/Hepatic Neg liver ROS, GERD  ,  Endo/Other  Hyperthyroidism   Renal/GU      Musculoskeletal   Abdominal   Peds  Hematology negative hematology ROS (+)   Anesthesia Other Findings Past Medical History: No date: Arthritis No date: BPH (benign prostatic hyperplasia) 9371: Complication of anesthesia     Comment:  after knee replacement, pt had delerium No date: COVID-19 No date: Diverticulitis No date: GERD (gastroesophageal reflux disease) No date: H. pylori infection No date: History of kidney stones No date: Hyperlipemia No date: Hypertension No date: Hyperthyroidism 2022: Sepsis ([place]) No date: Thyroid disease No date: Thyroid nodule No date: Vitamin B12 deficiency  Past Surgical History: No date: CARPAL TUNNEL RELEASE; Bilateral No date: COLONOSCOPY     Comment:  x2 No date: HERNIA REPAIR No date: HYDROCELE EXCISION No date: REPLACEMENT TOTAL KNEE; Left No date: THYROIDECTOMY, PARTIAL No date: TONSILLECTOMY 01/30/2022: TRANSURETHRAL RESECTION OF PROSTATE     Comment:  at [facility]  BMI    Body Mass Index: 25.84 kg/m      Reproductive/Obstetrics negative OB ROS                             Anesthesia Physical Anesthesia Plan  ASA: 2  Anesthesia Plan: General   Post-op Pain Management:     Induction:   PONV Risk Score and Plan: Ondansetron, Dexamethasone, Midazolam and Treatment may vary due to age or medical condition  Airway Management Planned:   Additional Equipment:   Intra-op Plan:   Post-operative Plan:   Informed Consent: I have reviewed the patients History and Physical, chart, labs and discussed the procedure including the risks, benefits and alternatives for the proposed anesthesia with the patient or authorized representative who has indicated his/her understanding and acceptance.     Dental Advisory Given  Plan Discussed with: Anesthesiologist, CRNA and Surgeon  Anesthesia Plan Comments:         Anesthesia Quick Evaluation

## 2022-05-02 ENCOUNTER — Encounter: Payer: Self-pay | Admitting: Orthopedic Surgery

## 2022-05-02 MED ORDER — OXYCODONE HCL 5 MG PO TABS: 5.0000 mg | ORAL_TABLET | ORAL | Status: DC | PRN

## 2022-05-02 MED ORDER — OXYCODONE HCL 5 MG PO TABS
5.0000 mg | ORAL_TABLET | ORAL | 0 refills | Status: DC | PRN
Start: 2022-05-02 — End: 2022-07-04

## 2022-05-02 MED ORDER — ENOXAPARIN SODIUM 40 MG/0.4ML IJ SOSY: 40.0000 mg | PREFILLED_SYRINGE | INTRAMUSCULAR | 0 refills | Status: DC

## 2022-05-02 MED ORDER — CELECOXIB 200 MG PO CAPS: 200.0000 mg | ORAL_CAPSULE | Freq: Two times a day (BID) | ORAL | 0 refills | Status: DC

## 2022-05-02 MED ORDER — TRAMADOL HCL 50 MG PO TABS: 50.0000 mg | ORAL_TABLET | ORAL | 0 refills | Status: DC | PRN

## 2022-05-02 NOTE — Progress Notes (Signed)
DISCHARGE NOTE:  Pt and wife given discharge instructions, they verbalized understanding. Pt given 2 extra honeycomb dressing, TED on both legs. Bone foam and polar care, sent with pt. Pt wheeled to car by staff.

## 2022-05-02 NOTE — Evaluation (Signed)
Occupational Therapy Evaluation Patient Details Name: Ernest Hutchinson MRN: [id number] DOB: July 27, 1951 Today's Date: 05/02/2022   History of Present Illness Pt is a 71 yo M s/p L TKA revision 05/01/22. PMH includes HTN, GERD, hyperthyroidism, arthiris, BPH, HLD   Clinical Impression   Ernest Hutchinson was seen for OT evaluation this date, POD#1 from above surgery. Pt was independent in all ADLs prior to surgery, and denies falls history in the last 6 months. Pt is eager to return to PLOF with less pain and improved safety and independence. Pt currently requires SET UP assist for LB dressing while in seated position due to pain and limited AROM of L knee. Pt/caregiver instructed in polar care mgt, falls prevention strategies, home/routines modifications, DME/AE for LB bathing and dressing tasks, and compression stocking mgt. Pt would benefit from skilled OT services including additional instruction in dressing techniques with or without assistive devices for dressing and bathing skills to support recall and carryover prior to discharge and ultimately to maximize safety, independence, and minimize falls risk and caregiver burden. Do not currently anticipate any OT needs following this hospitalization.         Recommendations for follow up therapy are one component of a multi-disciplinary discharge planning process, led by the attending physician.  Recommendations may be updated based on patient status, additional functional criteria and insurance authorization.   Follow Up Recommendations  No OT follow up    Assistance Recommended at Discharge PRN  Patient can return home with the following      Functional Status Assessment  Patient has had a recent decline in their functional status and demonstrates the ability to make significant improvements in function in a reasonable and predictable amount of time.  Equipment Recommendations  None recommended by OT    Recommendations for Other Services        Precautions / Restrictions Precautions Precautions: Fall;Knee Precaution Booklet Issued: Yes (comment) Restrictions Weight Bearing Restrictions: Yes LLE Weight Bearing: Weight bearing as tolerated      Mobility Bed Mobility               General bed mobility comments: deferred, pt in recliner at start/end of session.    Transfers Overall transfer level: Needs assistance Equipment used: Rolling walker (2 wheels) Transfers: Sit to/from Stand Sit to Stand: Min guard                  Balance Overall balance assessment: Needs assistance Sitting-balance support: Bilateral upper extremity supported, Feet supported Sitting balance-Leahy Scale: Normal Sitting balance - Comments: steady with dynamic sitting balance during functional activity.   Standing balance support: Bilateral upper extremity supported, During functional activity Standing balance-Leahy Scale: Good                             ADL either performed or assessed with clinical judgement   ADL Overall ADL's : Needs assistance/impaired                                       General ADL Comments: Pt functionally limited by decreased AROM of LLE and increased pain with functional activity. Requires SET UP assist for LB ADL management in seated position. Able to reach BLE to doff don bilat hospital socks during session.     Vision Patient Visual Report: No change from baseline  Perception     Praxis      Pertinent Vitals/Pain Pain Assessment Pain Assessment: Faces Faces Pain Scale: Hurts a little bit Pain Location: L knee Pain Descriptors / Indicators: Discomfort Pain Intervention(s): Limited activity within patient's tolerance, Monitored during session, Repositioned, Ice applied     Hand Dominance Right   Extremity/Trunk Assessment Upper Extremity Assessment Upper Extremity Assessment: Overall WFL for tasks assessed   Lower Extremity Assessment Lower  Extremity Assessment: Overall WFL for tasks assessed;Defer to PT evaluation LLE Deficits / Details: Pt able to complete SLR   Cervical / Trunk Assessment Cervical / Trunk Assessment: Normal   Communication Communication Communication: No difficulties   Cognition Arousal/Alertness: Awake/alert Behavior During Therapy: WFL for tasks assessed/performed Overall Cognitive Status: Within Functional Limits for tasks assessed                                       General Comments       Exercises Other Exercises Other Exercises: Pt/spouse educated on in falls prevention strategies, safe use of AE/DME for LB ADL management, compression stocking management, polar care management, complementary alternative methods for pain management including gentle self-massage and distraction techniques, and routines modifications to support safety and fxl independence during meaningful occupations of daily life.   Shoulder Instructions      Home Living Family/patient expects to be discharged to:: Private residence Living Arrangements: Spouse/significant other Available Help at Discharge: Family;Available 24 hours/day Type of Home: House Home Access: Stairs to enter [employer] of Steps: 4 Entrance Stairs-Rails: Can reach both Home Layout: One level     Bathroom Shower/Tub: Occupational psychologist: Handicapped height Bathroom Accessibility: Yes   Home Equipment: Conservation officer, nature (2 wheels);Shower seat;Grab bars - tub/shower          Prior Functioning/Environment Prior Level of Function : Independent/Modified Independent;Driving             Mobility Comments: Ind community ambulator using no AD, no hx of falls ADLs Comments: Ind w/ ADLs/IADLs.        OT Problem List: Decreased coordination;Pain;Decreased range of motion;Decreased safety awareness;Decreased knowledge of use of DME or AE;Decreased knowledge of precautions      OT  Treatment/Interventions: Self-care/ADL training;Therapeutic activities;Therapeutic exercise;DME and/or AE instruction;Patient/family education;Balance training    OT Goals(Current goals can be found in the care plan section) Acute Rehab OT Goals Patient Stated Goal: To go home OT Goal Formulation: With patient Time For Goal Achievement: 05/16/22 Potential to Achieve Goals: Good ADL Goals Pt Will Perform Lower Body Dressing: with modified independence;with caregiver independent in assisting;with adaptive equipment (c LRAD PRN for improved safety and functional indep.) Pt Will Transfer to Toilet: regular height toilet;with modified independence (c LRAD PRN for improved safety and functional indep.) Pt Will Perform Toileting - Clothing Manipulation and hygiene: sit to/from stand;with modified independence (c LRAD PRN for improved safety and functional indep.)  OT Frequency: Min 2X/week    Co-evaluation              AM-PAC OT "6 Clicks" Daily Activity     Outcome Measure Help from another person eating meals?: None Help from another person taking care of personal grooming?: None Help from another person toileting, which includes using toliet, bedpan, or urinal?: A Little Help from another person bathing (including washing, rinsing, drying)?: A Little Help from another person to put on and taking off regular  upper body clothing?: None Help from another person to put on and taking off regular lower body clothing?: A Little 6 Click Score: 21   End of Session Equipment Utilized During Treatment: Rolling walker (2 wheels)  Activity Tolerance: Patient tolerated treatment well Patient left: in chair;with chair alarm set;with family/visitor present  OT Visit Diagnosis: Other abnormalities of gait and mobility (R26.89);Pain Pain - Right/Left: Left Pain - part of body: Knee                Time: 9480-1655 OT Time Calculation (min): 31 min Charges:  OT General Charges $OT Visit: 1 Visit OT  Evaluation $OT Eval Moderate Complexity: 1 Mod OT Treatments $Self Care/Home Management : 23-37 mins  Shara Blazing, M.S., OTR/L Ascom: [phone] 05/02/22, 10:27 AM

## 2022-05-02 NOTE — Plan of Care (Signed)

## 2022-05-02 NOTE — Discharge Summary (Signed)
Physician Discharge Summary  Patient ID: Ernest Hutchinson MRN: [id number] DOB/AGE: 1951-04-23 71 y.o.  Admit date: 05/01/2022 Discharge date: 05/02/2022  Admission Diagnoses:  S/P revision of total knee [Z96.659]  Surgeries:Procedure(s): Left total knee revision arthroplasty (femoral and tibial components)   SURGEON:  Marciano Sequin. M.D.   ASSISTANT: Cassell Smiles, PA-C (present and scrubbed throughout the case, critical for assistance with exposure, retraction, instrumentation, and closure)   ANESTHESIA: general   ESTIMATED BLOOD LOSS: 100 mL   FLUIDS REPLACED: 2700 mL of crystalloid   TOURNIQUET TIME: #1 - 120 minutes         #2 - 53 minutes   DRAINS: 2 medium Hemovac drains   IMPLANTS UTILIZED: DePuy Attune Revision CRS size 5 femoral component, 4 mm posterior augments (medial and lateral), 30 mm fully porous-coated femoral sleeve, 16 mm x 60 mm press-fit stem, size 4 revision rotating platform tibial component, 29 mm fully porous-coated tibial sleeve, 12 mm x 60 mm press-fit stem, and an 8 mm revision CRS stabilized rotating platform polyethylene insert.  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 pylori infection 09/01/2014   Nephrolithiasis 06/15/2014   H/O partial thyroidectomy 09/25/2012   Thyroid nodule 09/25/2012   Essential hypertension 02/14/2012   Hyperlipidemia, mixed 02/14/2012    Past Medical History:  Diagnosis Date   Arthritis    BPH  (benign prostatic hyperplasia)    Complication of anesthesia 2021   after knee replacement, pt had delerium   COVID-19    Diverticulitis    GERD (gastroesophageal reflux disease)    H. pylori infection    History of kidney stones    Hyperlipemia    Hypertension    Hyperthyroidism    Sepsis ([place]) 2022   Thyroid disease    Thyroid nodule    Vitamin B12 deficiency      Transfusion:    Consultants (if any):   Discharged Condition: Improved  Hospital Course: Ernest Hutchinson is an 71 y.o. male who was admitted 05/01/2022 with a diagnosis of loosening of left knee prosthesis and went to the operating room on 05/01/2022 and underwent revision left total knee arthoplasty. The patient received perioperative antibiotics for prophylaxis (see below). The patient tolerated the procedure well and was transported to PACU in stable condition. After meeting PACU criteria, the patient was subsequently transferred to the Orthopaedics/Rehabilitation unit.   The patient received DVT prophylaxis in the form of early mobilization, Lovenox, TED hose, and SCDs . A sacral pad had been placed and heels were elevated off of the bed with rolled towels in order to protect skin integrity. Foley catheter was discontinued on postoperative day #0. Wound drains were discontinued on postoperative day #1. The surgical incision was healing well without signs of infection.  Physical therapy was initiated postoperatively for transfers, gait training, and strengthening. Occupational therapy was initiated for activities of daily living and evaluation for assisted devices. Rehabilitation goals were reviewed in detail with the patient. The patient made steady progress  with physical therapy and physical therapy recommended discharge to Home.   The patient achieved the preliminary goals of this hospitalization and was felt to be medically and orthopaedically appropriate for discharge.  He was given perioperative antibiotics:   Anti-infectives (From admission, onward)    Start     Dose/Rate Route Frequency Ordered Stop   05/01/22 1600  ceFAZolin (ANCEF) IVPB 2g/100 mL premix        2 g 200 mL/hr over 30 Minutes Intravenous Every 6 hours 05/01/22 1534 05/01/22 2218   05/01/22 1539  ceFAZolin (ANCEF) 2-4 GM/100ML-% IVPB       Note to Pharmacy: Tippett, Amy J: cabinet override      05/01/22 1539 05/01/22 1545   05/01/22 0615  ceFAZolin (ANCEF) IVPB 2g/100 mL premix        2 g 200 mL/hr over 30 Minutes Intravenous On call to O.R. 05/01/22 0601 05/01/22 0821   05/01/22 0609  ceFAZolin (ANCEF) 2-4 GM/100ML-% IVPB       Note to Pharmacy: Rutherford Nail E: cabinet override      05/01/22 0609 05/01/22 1620     .  Recent vital signs:  Vitals:   05/02/22 0803 05/02/22 1107  BP: 132/74 120/60  Pulse: 89 89  Resp: 16 16  Temp: 98.4 F (36.9 C) 97.8 F (36.6 C)  SpO2: 99% 99%    Recent laboratory studies:  No results for input(s): "WBC", "HGB", "HCT", "PLT", "K", "CL", "CO2", "BUN", "CREATININE", "GLUCOSE", "CALCIUM", "LABPT", "INR" in the last 72 hours.  Diagnostic Studies: DG Knee Left Port  Result Date: 05/01/2022 CLINICAL DATA:  Total knee replacement EXAM: PORTABLE LEFT KNEE - 1-2 VIEW COMPARISON:  None Available. FINDINGS: The patient is status post total knee replacement. Femoral and tibial components are in good position. A surgical drain is identified. Skin staples are noted. No other abnormalities. IMPRESSION: Left knee replacement as above.  Surgical drain as above. Electronically Signed   By: Dorise Bullion III M.D.   On: 05/01/2022 15:52    Discharge Medications:   Allergies as of 05/02/2022       Reactions   Ceftin [cefuroxime] Anaphylaxis   Cefuroxime Axetil Anaphylaxis   Chlorhexidine Gluconate Itching        Medication List     STOP taking these medications    ibuprofen 200 MG tablet Commonly known as: ADVIL       TAKE these medications    atenolol 50 MG tablet Commonly  known as: TENORMIN Take 50 mg by mouth daily. In morning   celecoxib 200 MG capsule Commonly known as: CELEBREX Take 1 capsule (200 mg total) by mouth 2 (two) times daily.   Cranberry 500 MG Caps Take 1 capsule by mouth 2 (two) times daily.   diazepam 5 MG tablet Commonly known as: VALIUM Take 5-7.5 mg by mouth at bedtime as needed.   enoxaparin 40 MG/0.4ML injection Commonly known as: LOVENOX Inject 0.4 mLs (40 mg total) into the skin daily for 14 days.   finasteride 5 MG tablet Commonly known as: PROSCAR Take 5 mg by mouth daily. In morning   gabapentin 300 MG capsule Commonly known as: NEURONTIN Take 300 mg by mouth 2 (two) times daily. At bedtime   lisinopril 5 MG tablet Commonly known as: ZESTRIL Take 5 mg by mouth at bedtime.   oxyCODONE 5 MG immediate release tablet Commonly known as: Oxy IR/ROXICODONE Take 1 tablet (5 mg total) by mouth every 4 (four) hours as needed for severe pain.  rosuvastatin 20 MG tablet Commonly known as: CRESTOR Take 20 mg by mouth at bedtime.   SYSTANE OP Apply 1 drop to eye as needed.   tamsulosin 0.4 MG Caps capsule Commonly known as: FLOMAX Take 0.4 mg by mouth in the morning and at bedtime.   traMADol 50 MG tablet Commonly known as: ULTRAM Take 1 tablet (50 mg total) by mouth every 4 (four) hours as needed for moderate pain.   vitamin B-12 1000 MCG tablet Commonly known as: CYANOCOBALAMIN Take 1,000 mcg by mouth daily.   Vitamin D-3 125 MCG (5000 UT) Tabs Take 1 tablet by mouth daily at 6 (six) AM.               Durable Medical Equipment  (From admission, onward)           Start     Ordered   05/01/22 1750  DME Walker rolling  Once       Question:  Patient needs a walker to treat with the following condition  Answer:  Total knee replacement status   05/01/22 1750   05/01/22 1750  DME Bedside commode  Once       Question:  Patient needs a bedside commode to treat with the following condition  Answer:   Total knee replacement status   05/01/22 1750            Disposition: Home with home health PT     Follow-up Information     Watt Climes, PA Follow up on 05/15/2022.   Specialty: Physician Assistant Why: at 9:45am Contact information: [place] [address] [phone]         Dereck Leep, MD Follow up on 06/13/2022.   Specialty: Orthopedic Surgery Why: at 2:45pm Contact information: [place] [address] [place], PA-C 05/02/2022, 3:29 PM

## 2022-05-02 NOTE — Evaluation (Signed)
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 PMH includes HTN, GERD, hyperthyroidism, arthiris, BPH, HLD  Clinical Impression  Pt was pleasant and motivated to participate during the session and put forth good effort throughout. Pt completed bed mobility w/ supervision with extra time and effort to complete. Pt able to perform sit to stands w/ CGA with min cuing for hand and foot placement. Pt able to complete 4 steps using bilat rails w/ CGA with good stability w/ no LOB; good concentric/eccentric control using RLE and min cuing for hand placement.  Pt ambulated 275f using RW w/ CGA with steady gait w/ no LOB; min cuing to maintain RW close and to normalize L knee ROM during gait. Pt will benefit from HHPT upon discharge to safely address deficits listed in patient problem list for decreased caregiver assistance and eventual return to PLOF.       Recommendations for follow up therapy are one component of a multi-disciplinary discharge planning process, led by the attending physician.  Recommendations may be updated based on patient status, additional functional criteria and insurance authorization.  Follow Up Recommendations Home health PT      Assistance Recommended at Discharge Intermittent Supervision/Assistance  Patient can return home with the following  A little help with walking and/or transfers;A little help with bathing/dressing/bathroom;Assistance with cooking/housework;Assist for transportation;Help with stairs or ramp for entrance    Equipment Recommendations None recommended by PT  Recommendations for Other Services       Functional Status Assessment Patient has had a recent decline in their functional status and demonstrates the ability to make significant improvements in function in a reasonable and predictable amount of  time.     Precautions / Restrictions Precautions Precautions: Fall;Knee Precaution Booklet Issued: Yes (comment) Restrictions Weight Bearing Restrictions: Yes LLE Weight Bearing: Weight bearing as tolerated      Mobility  Bed Mobility Overal bed mobility: Needs Assistance Bed Mobility: Supine to Sit     Supine to sit: Supervision          Transfers Overall transfer level: Needs assistance Equipment used: Rolling walker (2 wheels) Transfers: Sit to/from Stand Sit to Stand: Min guard           General transfer comment: min cuing for hand and foot placement    Ambulation/Gait Ambulation/Gait assistance: Min guard Gait Distance (Feet): 200 Feet Assistive device: Rolling walker (2 wheels) Gait Pattern/deviations: Step-through pattern, Decreased step length - right, Decreased step length - left Gait velocity: decreased     General Gait Details: steady gait w/ no LOB; min cuing to maintain RW close. tends to keep L knee in ext throughout cycle  Stairs Stairs: Yes Stairs assistance: Min guard Stair Management: Two rails, Step to pattern, Forwards Number of Stairs: 4 General stair comments: good stability w/ no LOB; good concentric/eccentric control w/ RLE. min cuing for sequencing of steps and hand placement  Wheelchair Mobility    Modified Rankin (Stroke Patients Only)       Balance Overall balance assessment: Needs assistance Sitting-balance support: Bilateral upper extremity supported, Feet supported Sitting balance-Leahy Scale: Good     Standing balance support: Bilateral upper extremity supported, During functional activity Standing balance-Leahy Scale: Fair  Pertinent Vitals/Pain Pain Assessment Pain Assessment: 0-10 Pain Score: 6  Pain Location: L knee Pain Descriptors / Indicators: Aching Pain Intervention(s): Monitored during session, Repositioned    Home Living Family/patient expects to be  discharged to:: Private residence Living Arrangements: Spouse/significant other (wife) Available Help at Discharge: Family;Available 24 hours/day Type of Home: House Home Access: Stairs to enter Entrance Stairs-Rails: Can reach both Entrance Stairs-Number of Steps: 4   Home Layout: One level Home Equipment: Conservation officer, nature (2 wheels);Shower seat;Grab bars - tub/shower      Prior Function Prior Level of Function : Independent/Modified Independent             Mobility Comments: Ind community ambulator using no AD, no hx of falls ADLs Comments: Ind w/ ADLs     Hand Dominance        Extremity/Trunk Assessment   Upper Extremity Assessment Upper Extremity Assessment: Overall WFL for tasks assessed    Lower Extremity Assessment Lower Extremity Assessment: Generalized weakness;LLE deficits/detail LLE Deficits / Details: Pt able to complete SLR       Communication   Communication: No difficulties  Cognition Arousal/Alertness: Awake/alert Behavior During Therapy: WFL for tasks assessed/performed Overall Cognitive Status: Within Functional Limits for tasks assessed                                          General Comments      Exercises Total Joint Exercises Goniometric ROM: L knee AROM 0 - 70 degrees Other Exercises Other Exercises: Pt education on stair sequencing Other Exercises: HEP education per handout   Assessment/Plan    PT Assessment Patient needs continued PT services  PT Problem List Decreased strength;Decreased mobility;Decreased activity tolerance;Decreased balance;Decreased knowledge of use of DME       PT Treatment Interventions Therapeutic exercise;DME instruction;Gait training;Balance training;Stair training;Functional mobility training;Therapeutic activities;Patient/family education    PT Goals (Current goals can be found in the Care Plan section)  Acute Rehab PT Goals Patient Stated Goal: pt would like to get back to more  walking PT Goal Formulation: With patient Time For Goal Achievement: 05/15/22 Potential to Achieve Goals: Good    Frequency BID     Co-evaluation               AM-PAC PT "6 Clicks" Mobility  Outcome Measure Help needed turning from your back to your side while in a flat bed without using bedrails?: None Help needed moving from lying on your back to sitting on the side of a flat bed without using bedrails?: None Help needed moving to and from a bed to a chair (including a wheelchair)?: A Little Help needed standing up from a chair using your arms (e.g., wheelchair or bedside chair)?: None Help needed to walk in hospital room?: A Little Help needed climbing 3-5 steps with a railing? : A Little 6 Click Score: 21    End of Session Equipment Utilized During Treatment: Gait belt Activity Tolerance: Patient tolerated treatment well Patient left: in chair;with call bell/phone within reach;with chair alarm set;with family/visitor present Nurse Communication: Mobility status PT Visit Diagnosis: Other abnormalities of gait and mobility (R26.89);Muscle weakness (generalized) (M62.81);Pain Pain - Right/Left: Left Pain - part of body: Knee    Time: 4193-7902 PT Time Calculation (min) (ACUTE ONLY): 40 min   Charges:             Turner Daniels, SPT  05/02/2022,  9:37 AM

## 2022-05-02 NOTE — Progress Notes (Signed)
Met with the patient and his wife in the room at the bedside He is set up with [place] for [facility] He has a Rolling walker and grab bars as well as a shower seat No other needs identified

## 2022-05-02 NOTE — Progress Notes (Signed)
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 PMH includes HTN, GERD, hyperthyroidism, arthiris, BPH, HLD    PT Comments    Pt was pleasant and motivated to participate during the session and put forth good effort throughout. Pt complete supine to sit w/ supervision. Pt able to complete sit to stands w/ CGA with min cuing for hand and foot placement. Pt ambulated 245f using RW w/ CGA with steady gait and no LOB; min cuing to maintain RW close. Continues to improve ROM for normalized gait but still tends to keep L knee slightly extended during gait. Pt will benefit from HHPT upon discharge to safely address deficits listed in patient problem list for decreased caregiver assistance and eventual return to PLOF.    Recommendations for follow up therapy are one component of a multi-disciplinary discharge planning process, led by the attending physician.  Recommendations may be updated based on patient status, additional functional criteria and insurance authorization.  Follow Up Recommendations  Home health PT     Assistance Recommended at Discharge Intermittent Supervision/Assistance  Patient can return home with the following A little help with walking and/or transfers;A little help with bathing/dressing/bathroom;Assistance with cooking/housework;Assist for transportation;Help with stairs or ramp for entrance   Equipment Recommendations  None recommended by PT    Recommendations for Other Services       Precautions / Restrictions Precautions Precautions: Fall;Knee Precaution Booklet Issued: Yes (comment) Restrictions Weight Bearing Restrictions: Yes LLE Weight Bearing: Weight bearing as tolerated     Mobility  Bed Mobility Overal bed mobility: Needs Assistance Bed Mobility: Supine to Sit     Supine to sit: Supervision           Transfers Overall transfer level: Needs assistance Equipment used: Rolling walker (2 wheels) Transfers: Sit to/from Stand Sit to Stand: Min guard           General transfer comment: min cuing for hand and foot placement    Ambulation/Gait Ambulation/Gait assistance: Min guard Gait Distance (Feet): 200 Feet Assistive device: Rolling walker (2 wheels) Gait Pattern/deviations: Step-through pattern, Decreased step length - right, Decreased step length - left Gait velocity: decreased     General Gait Details: slow steady gait w/ no LOB; min cuing to maintain RW close. improvement w/ trying to normalize gait cycle   Stairs Stairs: Yes Stairs assistance: Min guard Stair Management: Two rails, Step to pattern, Forwards Number of Stairs: 4 General stair comments: good stability w/ no LOB; good concentric/eccentric control w/ RLE. min cuing for sequencing of steps and hand placement   Wheelchair Mobility    Modified Rankin (Stroke Patients Only)       Balance Overall balance assessment: Needs assistance Sitting-balance support: Bilateral upper extremity supported, Feet supported Sitting balance-Leahy Scale: Good     Standing balance support: Bilateral upper extremity supported, During functional activity Standing balance-Leahy Scale: Fair                              Cognition Arousal/Alertness: Awake/alert Behavior During Therapy: WFL for tasks assessed/performed Overall Cognitive Status: Within Functional Limits for tasks assessed  Exercises Total Joint Exercises Ankle Circles/Pumps: Strengthening, Both, 10 reps Quad Sets: Strengthening, Both, 10 reps Gluteal Sets: Strengthening, Both, 10 reps Heel Slides: Strengthening, Both, 10 reps Hip ABduction/ADduction: Strengthening, Both, 10 reps Straight Leg Raises: Strengthening, Both, 10 reps Long Arc Quad: Strengthening, Both, 10  reps Goniometric ROM: L knee AROM 0-80 degrees Other Exercises     General Comments        Pertinent Vitals/Pain Pain Assessment Pain Assessment: No/denies pain Pain Score: 6  Pain Location: L knee Pain Descriptors / Indicators: Aching Pain Intervention(s): Monitored during session, Repositioned, Ice applied    Home Living Family/patient expects to be discharged to:: Private residence Living Arrangements: Spouse/significant other Available Help at Discharge: Family;Available 24 hours/day Type of Home: House Home Access: Stairs to enter Entrance Stairs-Rails: Can reach both Entrance Stairs-Number of Steps: 4   Home Layout: One level Home Equipment: Conservation officer, nature (2 wheels);Shower seat;Grab bars - tub/shower      Prior Function            PT Goals (current goals can now be found in the care plan section) Progress towards PT goals: Progressing toward goals    Frequency    BID      PT Plan Current plan remains appropriate    Co-evaluation              AM-PAC PT "6 Clicks" Mobility   Outcome Measure  Help needed turning from your back to your side while in a flat bed without using bedrails?: None Help needed moving from lying on your back to sitting on the side of a flat bed without using bedrails?: None Help needed moving to and from a bed to a chair (including a wheelchair)?: A Little Help needed standing up from a chair using your arms (e.g., wheelchair or bedside chair)?: None Help needed to walk in hospital room?: A Little Help needed climbing 3-5 steps with a railing? : A Little 6 Click Score: 21    End of Session Equipment Utilized During Treatment: Gait belt Activity Tolerance: Patient tolerated treatment well Patient left: in chair;with call bell/phone within reach;with chair alarm set;with family/visitor present Nurse Communication: Mobility status PT Visit Diagnosis: Other abnormalities of gait and mobility (R26.89);Muscle weakness  (generalized) (M62.81);Pain Pain - Right/Left: Left Pain - part of body: Knee     Time: 1319-1350 PT Time Calculation (min) (ACUTE ONLY): 31 min  Charges:                        Turner Daniels, SPT  05/02/2022, 2:11 PM

## 2022-05-02 NOTE — Progress Notes (Signed)
  Subjective: 1 Day Post-Op Procedure(s) (LRB): TOTAL KNEE REVISION (Left) Wife at bedside. Patient reports pain as moderate.   Patient is well, and has had no acute complaints or problems Plan is to go Home after hospital stay. Negative for chest pain and shortness of breath Fever: no Gastrointestinal: negative for nausea and vomiting.  Patient has not had a bowel movement.  Objective: Vital signs in last 24 hours: Temp:  [97.5 F (36.4 C)-98.4 F (36.9 C)] 98.4 F (36.9 C) (07/13 0307) Pulse Rate:  [94-106] 98 (07/13 0307) Resp:  [13-20] 15 (07/13 0307) BP: (110-130)/(63-83) 123/72 (07/13 0307) SpO2:  [95 %-100 %] 97 % (07/13 0307)  Intake/Output from previous day:  Intake/Output Summary (Last 24 hours) at 05/02/2022 0750 Last data filed at 05/02/2022 0045 Gross per 24 hour  Intake 3480 ml  Output 2930 ml  Net 550 ml    Intake/Output this shift: No intake/output data recorded.  Labs: No results for input(s): "HGB" in the last 72 hours. No results for input(s): "WBC", "RBC", "HCT", "PLT" in the last 72 hours. No results for input(s): "NA", "K", "CL", "CO2", "BUN", "CREATININE", "GLUCOSE", "CALCIUM" in the last 72 hours. No results for input(s): "LABPT", "INR" in the last 72 hours.   EXAM General - Patient is Alert, Appropriate, and Orientedx3 Extremity - Neurovascular intact Dorsiflexion/Plantar flexion intact Compartment soft Dressing/Incision -Postoperative dressing remains in place., Polar Care in place and working. , Hemovac in place.  Motor Function - intact, moving foot and toes well on exam. Able to perform independent SLR.  Cardiovascular- Regular rate and rhythm, no murmurs/rubs/gallops Respiratory- Lungs clear to auscultation bilaterally Gastrointestinal- soft, nontender, and active bowel sounds   Assessment/Plan: 1 Day Post-Op Procedure(s) (LRB): TOTAL KNEE REVISION (Left) Principal Problem:   S/P revision of total knee  Estimated body mass index is  25.84 kg/m as calculated from the following:   Height as of this encounter: '5\' 7"'$  (1.702 m).   Weight as of this encounter: 74.8 kg. Advance diet Up with therapy  Discussed possible d/c later today pending  completion of therapy goals.      DVT Prophylaxis - Lovenox, Ted hose, and SCDs Weight-Bearing as tolerated to left leg  Cassell Smiles, PA-C [facility] 05/02/2022, 7:50 AM

## 2022-05-06 LAB — AEROBIC/ANAEROBIC CULTURE W GRAM STAIN (SURGICAL/DEEP WOUND)
Culture: NO GROWTH
Gram Stain: NONE SEEN

## 2022-05-10 ENCOUNTER — Other Ambulatory Visit: Payer: Self-pay | Admitting: Orthopedic Surgery

## 2022-05-10 ENCOUNTER — Ambulatory Visit
Admission: RE | Admit: 2022-05-10 | Discharge: 2022-05-10 | Disposition: A | Discharge status: Home / Self Care | Payer: Medicare Other | Source: Ambulatory Visit | Attending: Orthopedic Surgery | Admitting: Orthopedic Surgery

## 2022-05-10 DIAGNOSIS — M79605 Pain in left leg: Secondary | ICD-10-CM | POA: Diagnosis present

## 2022-05-10 DIAGNOSIS — Z96652 Presence of left artificial knee joint: Secondary | ICD-10-CM | POA: Diagnosis present

## 2022-07-03 ENCOUNTER — Telehealth: Payer: Self-pay

## 2022-07-03 NOTE — Telephone Encounter (Signed)
Received call from patient who stated that he saw Dr. Sabra Heck this morning, and that provider said he did not need to follow up with Dr. Delaine Lame.  Patient denies any symptoms. States he has completed his recent course of Cipro. Stated he had a urine culture done Tuesday morning, but had not yet received results.  Requests advice on whether to keep his follow up appointment with Dr. Delaine Lame on 07/04/22, move it, or cancel it. Prefers a response via MyChart.  Request routed to provider.  Binnie Kand, RN

## 2022-07-04 ENCOUNTER — Ambulatory Visit: Payer: Medicare Other | Admitting: Infectious Diseases

## 2022-07-04 ENCOUNTER — Encounter: Payer: Self-pay | Admitting: Infectious Diseases

## 2022-07-04 ENCOUNTER — Ambulatory Visit: Payer: Medicare Other | Attending: Infectious Diseases | Admitting: Infectious Diseases

## 2022-07-04 VITALS — BP 93/4 | HR 85 | Temp 97.2°F | Ht 67.0 in | Wt 162.0 lb

## 2022-07-04 DIAGNOSIS — R3911 Hesitancy of micturition: Secondary | ICD-10-CM

## 2022-07-04 DIAGNOSIS — I10 Essential (primary) hypertension: Secondary | ICD-10-CM | POA: Diagnosis not present

## 2022-07-04 DIAGNOSIS — N401 Enlarged prostate with lower urinary tract symptoms: Secondary | ICD-10-CM | POA: Diagnosis not present

## 2022-07-04 DIAGNOSIS — Z79899 Other long term (current) drug therapy: Secondary | ICD-10-CM | POA: Diagnosis not present

## 2022-07-04 DIAGNOSIS — E785 Hyperlipidemia, unspecified: Secondary | ICD-10-CM | POA: Diagnosis not present

## 2022-07-04 NOTE — Progress Notes (Signed)
NAME: DOMNIC Hutchinson  DOB: 03-20-1951  MRN: [id number]  Date/Time: 07/04/2022 11:19 AM   Pt here with his wife  ? Ernest Hutchinson is a 71 y.o. with a history of hypertension, hyperlipidemia, hypothyroidism, BPH on finasteride and tamsulosin is referred to me by his PCP for recent urine culture being positive for ESBL klebsiella I had last seen him 09/25/21 when he had ESBL kleb bacteremia thought to be due to BPH and incomplete bladder emptying ( PVR 200cc). He completed 2 weeks of ertapenem- he followed up with his urologist at [facility]. HE was admitted to [facility] 10/30/21-11/05/21 for sepsis secondary to UTI ESBL kleb and treated with 7 days of meropenem. HE was seen by ID- he had some encephalopathy.  12/01/21: CT abdomen GU protocol. Mild hypoenhancement in bilateral kidneys, punctate non-obstructing nephrolithiasis. 12/13/21: Urology follow-up, no voiding issues, PVR 73. Treated with 90 day course of Macrobid. UCx with ESBL Klebsiella (sensitive to only carbapenems and amikacin).  12/27/21: Cystoscopy with urology, urine culture with ESBL Klebsiella sensitive to only carbapenems, amikacin and ceftazidime-avibactam. Noted large bilobar BPH, no bladder lesions but friable mucosa.  Ernest Hutchinson 01/15/22: Urology follow-up, PVR 145 plan for Urologic procedure on 01/30/22. 01/30/22: Underwent TURP, uncomplicated. Treated with ertapenem for 10 days spanning the procedure  03/07/22: Urine culture checked, positive for Klebsiella (no symptoms). PVR 17 ml.  He saw surgeon  [name] on 03/12/22 to r/o colovesical fistula and had colonoscopy which according to the patient was N ( I cannot see that report). HE last saw ID [name] at [facility] on 03/17/22 Pt in preparation for knee joint replacement underwent aspiration of the knee on 03/26/22 and cell count normal   He underwent  left total knee revision arthroplasty  for loose implants on 05/01/22 and has been doing okay with PT,. HE has bene followed by Dr.Hooten On 06/18/22 he  went to his PCP as he was having some difficulty passing urine but could do it easily when he was in the shower . A UC. UA done on 06/10/22 showed ESBL kleb -sensitive to cipro and he was prescribed htta and it was increased to '750mg'$  A repeat UC on 9/12 had < 10K mixed flora Pt symptoms are the same- he still has trouble initiating a flow , and can do it easily in the shower. He does not have dysuria or abdominal pain His urologist at [facility] has retired and he needs anew urologist and would like to see Dr.Stoioff  Past Medical History:  Diagnosis Date   Arthritis    BPH (benign prostatic hyperplasia)    Complication of anesthesia 2021   after knee replacement, pt had delerium   COVID-19    Diverticulitis    GERD (gastroesophageal reflux disease)    H. pylori infection    History of kidney stones    Hyperlipemia    Hypertension    Hyperthyroidism    Sepsis ([place]) 2022   Thyroid disease    Thyroid nodule    Vitamin B12 deficiency     Past Surgical History:  Procedure Laterality Date   CARPAL TUNNEL RELEASE Bilateral    COLONOSCOPY     x2   HERNIA REPAIR     HYDROCELE EXCISION     REPLACEMENT TOTAL KNEE Left    THYROIDECTOMY, PARTIAL     TONSILLECTOMY     TOTAL KNEE REVISION Left 05/01/2022   Procedure: TOTAL KNEE REVISION;  Surgeon: Dereck Leep, MD;  Location: [facility] ORS;  Service: Orthopedics;  Laterality: Left;  TRANSURETHRAL RESECTION OF PROSTATE  01/30/2022   at [place] History   Marital status: Married    Spouse name: Not on file   Number of children: Not on file   Years of education: Not on file   Highest education level: Not on file  Occupational History   Not on file  Tobacco Use   Smoking status: Former    Packs/day: 1.50    Years: 20.00    Total pack years: 30.00    Types: Cigarettes    Quit date: 3    Years since quitting: 38.7   Smokeless tobacco: Never  Vaping Use   Vaping Use: Never used  Substance and Sexual  Activity   Alcohol use: Never   Drug use: Never   Sexual activity: Not on file  Other Topics Concern   Not on file  Social History Narrative   Not on file   Social Determinants of Health   Financial Resource Strain: Not on file  Food Insecurity: Not on file  Transportation Needs: Not on file  Physical Activity: Not on file  Stress: Not on file  Social Connections: Not on file  Intimate Partner Violence: Not on file    No family history on file. Allergies  Allergen Reactions   Ceftin [Cefuroxime] Anaphylaxis   Cefuroxime Axetil Anaphylaxis   Chlorhexidine Gluconate Itching   I? Current Outpatient Medications  Medication Sig Dispense Refill   atenolol (TENORMIN) 50 MG tablet Take 50 mg by mouth daily. In morning     Cholecalciferol (VITAMIN D-3) 125 MCG (5000 UT) TABS Take 1 tablet by mouth daily at 6 (six) AM.     Cranberry 500 MG CAPS Take 1 capsule by mouth 2 (two) times daily.     diazepam (VALIUM) 5 MG tablet Take 5-7.5 mg by mouth at bedtime as needed.     finasteride (PROSCAR) 5 MG tablet Take 5 mg by mouth daily. In morning     gabapentin (NEURONTIN) 300 MG capsule Take 300 mg by mouth 2 (two) times daily. At bedtime     lisinopril (ZESTRIL) 5 MG tablet Take 5 mg by mouth at bedtime.     Polyethyl Glycol-Propyl Glycol (SYSTANE OP) Apply 1 drop to eye as needed.     rosuvastatin (CRESTOR) 20 MG tablet Take 20 mg by mouth at bedtime.     tamsulosin (FLOMAX) 0.4 MG CAPS capsule Take 0.4 mg by mouth in the morning and at bedtime.     traMADol (ULTRAM) 50 MG tablet Take 1 tablet (50 mg total) by mouth every 4 (four) hours as needed for moderate pain. 30 tablet 0   vitamin B-12 (CYANOCOBALAMIN) 1000 MCG tablet Take 1,000 mcg by mouth daily.     No current facility-administered medications for this visit.     Abtx:  Anti-infectives (From admission, onward)    None       REVIEW OF SYSTEMS:  Const: negative fever, negative chills, negative weight loss Eyes:  negative diplopia or visual changes, negative eye pain ENT: negative coryza, negative sore throat Resp: negative cough, hemoptysis, dyspnea Cards: negative for chest pain, palpitations, lower extremity edema GU: Difficulty initiating micturition.  Negative for frequency, dysuria and hematuria GI: Negative for abdominal pain, diarrhea, bleeding, constipation Skin: negative for rash and pruritus Heme: negative for easy bruising and gum/nose bleeding MS: negative for myalgias, arthralgias, back pain and muscle weakness Neurolo:negative for headaches, dizziness, vertigo, memory problems  Psych:  anxiety, depression  Endocrine: Has thyroid issues  allergy/Immunology-as above:- ceftin, cefuroxime Objective:  VITALS:  BP (!) 93/4   Pulse 85   Temp (!) 97.2 F (36.2 C) (Temporal)   Ht '5\' 7"'$  (1.702 m)   Wt 162 lb (73.5 kg)   BMI 25.37 kg/m   PHYSICAL EXAM:  General: Alert, cooperative, no distress, appears stated age. Hard of hearing Head: Normocephalic, without obvious abnormality, atraumatic. Eyes: Conjunctivae clear, anicteric sclerae. Pupils are equal ENT Nares normal. No drainage or sinus tenderness. Lips, mucosa, and tongue normal. No Thrush Neck: Supple, symmetrical, no adenopathy, thyroid: non tender no carotid bruit and no JVD. Back: No CVA tenderness. Lungs: Clear to auscultation bilaterally. No Wheezing or Rhonchi. No rales. Heart: Regular rate and rhythm, no murmur, rub or gallop. Abdomen: Soft, non-tender,not distended. Bowel sounds normal. No masses Extremities: atraumatic, no cyanosis. No edema. No clubbing Skin: No rashes or lesions. Or bruising Lymph: Cervical, supraclavicular normal. Neurologic: Grossly non-focal Right PICC line site clean It was covered with a sock and had multiple tapes pertinent Labs Lab Results  ? Impression/Recommendation  PT has BPH and LUTS He did have TURP on 01/30/22 and seem to have had good urine flow a few months after that until  recently in the end of august when he developed trouble initiating flow and could do it eailty in the shower- His PCP did UC and treated the ESBL klebsiella which he is colonized since Nov last year, with cipro His symptoms have not changed at all eventhough UC is neg I dont think he has active infection He is colonized- so will not treat unless he has symptoms like fever, flank pain , or if he has 2 or more symptoms like dysuria, difficulty in passing urine/leucocytosis But we need to check for post void residue to look for incomplete emptying and relieve it if present  As his urologist at [facility] has retired will refer him to [place] ( requested by patient and wife as well) On tamsulosin and finasteride- may need to change ?  No colovesical fistula to explain the ESBL kleb coloniztion   H/o ESBL Klebsiella bacteremia with UTI.in NOV 2022 ? _Hypertension on atenolol  Hyperlipidemia on Crestor  Appt made with Dr.Stoioff for next week Discussed the management in great detail with aptient and his wife Follow with me as needed  __________________

## 2022-07-04 NOTE — Patient Instructions (Addendum)
You are here for a recent UTI with klebsiella, you have trouble initialiting flow and you say you can pass urine well in the shower- Will need to get a PVR - last was 17 ml in may Will get you an appt with our urologist here .

## 2022-07-05 ENCOUNTER — Encounter: Payer: Self-pay | Admitting: Urology

## 2022-07-05 ENCOUNTER — Ambulatory Visit (INDEPENDENT_AMBULATORY_CARE_PROVIDER_SITE_OTHER): Payer: Medicare Other | Admitting: Urology

## 2022-07-05 VITALS — BP 127/81 | HR 101 | Ht 67.0 in | Wt 163.0 lb

## 2022-07-05 DIAGNOSIS — Z87440 Personal history of urinary (tract) infections: Secondary | ICD-10-CM

## 2022-07-05 DIAGNOSIS — N401 Enlarged prostate with lower urinary tract symptoms: Secondary | ICD-10-CM | POA: Diagnosis not present

## 2022-07-05 DIAGNOSIS — N390 Urinary tract infection, site not specified: Secondary | ICD-10-CM

## 2022-07-05 LAB — URINALYSIS, COMPLETE
Bilirubin, UA: NEGATIVE
Glucose, UA: NEGATIVE
Ketones, UA: NEGATIVE
Leukocytes,UA: NEGATIVE
Nitrite, UA: NEGATIVE
Protein,UA: NEGATIVE
RBC, UA: NEGATIVE
Specific Gravity, UA: 1.015 (ref 1.005–1.030)
Urobilinogen, Ur: 0.2 mg/dL (ref 0.2–1.0)
pH, UA: 6 (ref 5.0–7.5)

## 2022-07-05 LAB — MICROSCOPIC EXAMINATION: Bacteria, UA: NONE SEEN

## 2022-07-05 LAB — BLADDER SCAN AMB NON-IMAGING: Scan Result: 301

## 2022-07-05 NOTE — Progress Notes (Signed)
Simple Catheter Placement  Due to urinary retention patient is present today for a foley cath placement.  Patient was cleaned and prepped in a sterile fashion with betadine and 2% lidocaine jelly was instilled into the urethra. A 14 coude foley catheter was inserted, urine return was noted  248m, urine was yellow in color.  The balloon was filled with 10cc of sterile water.  A leg bag was attached for drainage. Patient was also given a night bag to take home and was given instruction on how to change from one bag to another.  Patient was given instruction on proper catheter care.  Patient tolerated well, no complications were noted   Performed by: CElberta Leatherwood CMA  Additional notes/ Follow up: Monday for Cysto

## 2022-07-05 NOTE — Progress Notes (Signed)
07/05/2022 12:32 PM   Ernest Hutchinson April 18, 1951 [id number]  Referring provider: Tsosie Billing, MD [place],  [place] [address]  Chief Complaint  Patient presents with   Benign Prostatic Hypertrophy    HPI: Ernest Hutchinson is a 71 y.o. male presents to establish local urologic care.  Long history of BPH/prostatitis followed by Dr. Alford Highland at [facility] History of ESBL Klebsiella.  Hospitalized 09/2021 with ESBL Klebsiella bacteremia thought secondary to BPH and incomplete bladder emptying (200 cc PVR Hospitalized due to January 2023 with sepsis secondary to ESBL Klebsiella UTI; CTU no significant abnormalities; punctate bilateral nephrolithiasis Repeat ESBL Klebsiella a UTI 11/2021 Cystoscopy 12/2021 with prominent lateral lobe enlargement TURP performed at [facility] 01/30/2022 At his last [facility] follow-up he was voiding with a good stream and PVR was 25 mL Negative valuation for colovesical fistula 06/18/2022 began to have urinary hesitancy, decreased force and caliber of his urinary stream and sensation of incomplete emptying.  Urine culture + ESBL Klebsiella which was sensitive to Cipro.  He had improvement of his symptoms which returned and dose was increased to 750 mg for 5 days.  Follow-up urine culture 9/12 grew mixed flora He continues with bothersome obstructive voiding symptoms.  Occasionally he can void without problems and does fine he voids better in the shower while standing IPSS today 14/35   PMH: Past Medical History:  Diagnosis Date   Arthritis    BPH (benign prostatic hyperplasia)    Complication of anesthesia 2021   after knee replacement, pt had delerium   COVID-19    Diverticulitis    GERD (gastroesophageal reflux disease)    H. pylori infection    History of kidney stones    Hyperlipemia    Hypertension    Hyperthyroidism    Sepsis ([place]) 2022   Thyroid disease    Thyroid nodule    Vitamin B12 deficiency     Surgical History: Past  Surgical History:  Procedure Laterality Date   CARPAL TUNNEL RELEASE Bilateral    COLONOSCOPY     x2   HERNIA REPAIR     HYDROCELE EXCISION     REPLACEMENT TOTAL KNEE Left    THYROIDECTOMY, PARTIAL     TONSILLECTOMY     TOTAL KNEE REVISION Left 05/01/2022   Procedure: TOTAL KNEE REVISION;  Surgeon: Dereck Leep, MD;  Location: [facility] ORS;  Service: Orthopedics;  Laterality: Left;   TRANSURETHRAL RESECTION OF PROSTATE  01/30/2022   at [facility] Medications:  Allergies as of 07/05/2022       Reactions   Ceftin [cefuroxime] Anaphylaxis   Cefuroxime Axetil Anaphylaxis   Chlorhexidine Gluconate Itching        Medication List        Accurate as of July 05, 2022 12:32 PM. If you have any questions, ask your nurse or doctor.          atenolol 50 MG tablet Commonly known as: TENORMIN Take 50 mg by mouth daily. In morning   Cranberry 500 MG Caps Take 1 capsule by mouth 2 (two) times daily.   cyanocobalamin 1000 MCG tablet Commonly known as: VITAMIN B12 Take 1,000 mcg by mouth daily.   diazepam 5 MG tablet Commonly known as: VALIUM Take 5-7.5 mg by mouth at bedtime as needed.   finasteride 5 MG tablet Commonly known as: PROSCAR Take 5 mg by mouth daily. In morning   gabapentin 300 MG capsule Commonly known as: NEURONTIN Take 300 mg by  mouth 2 (two) times daily. At bedtime   lisinopril 5 MG tablet Commonly known as: ZESTRIL Take 5 mg by mouth at bedtime.   rosuvastatin 20 MG tablet Commonly known as: CRESTOR Take 20 mg by mouth at bedtime.   SYSTANE OP Apply 1 drop to eye as needed.   tamsulosin 0.4 MG Caps capsule Commonly known as: FLOMAX Take 0.4 mg by mouth in the morning and at bedtime.   traMADol 50 MG tablet Commonly known as: ULTRAM Take 1 tablet (50 mg total) by mouth every 4 (four) hours as needed for moderate pain.   Vitamin D-3 125 MCG (5000 UT) Tabs Take 1 tablet by mouth daily at 6 (six) AM.        Allergies:  Allergies   Allergen Reactions   Ceftin [Cefuroxime] Anaphylaxis   Cefuroxime Axetil Anaphylaxis   Chlorhexidine Gluconate Itching    Family History: History reviewed. No pertinent family history.  Social History:  reports that he quit smoking about 38 years ago. His smoking use included cigarettes. He has a 30.00 pack-year smoking history. He has never used smokeless tobacco. He reports that he does not drink alcohol and does not use drugs.   Physical Exam: BP 127/81   Pulse (!) 101   Ht '5\' 7"'$  (1.702 m)   Wt 163 lb (73.9 kg)   BMI 25.53 kg/m   Constitutional:  Alert and oriented, No acute distress. HEENT: [place] AT Respiratory: Normal respiratory effort, no increased work of breathing. Psychiatric: Normal mood and affect.  Laboratory Data:  Urinalysis Cath urine negative microscopy   Assessment & Plan:    1. Benign localized prostatic hyperplasia with lower urinary tract symptoms (LUTS) Status post TURP April 2023 and had no bothersome voiding symptoms until 2 weeks ago with onset of obstructive voiding symptoms PVR today was 301 mL Recommend cystoscopy to evaluate for the possibility of urethral stricture or bladder neck contracture I initially offered an attempt at catheter placement and he elected not to however returned a few hours later requesting catheter placement because he was concerned about urinary retention over the weekend and needing to go to the ED A 14 French Foley catheter was placed without difficulty thus the likelihood of a significant stricture or bladder neck contracture is extremely low   Abbie Sons, MD  [place] [address] [place], [place] [address] [phone]

## 2022-07-06 ENCOUNTER — Encounter: Payer: Self-pay | Admitting: Intensive Care

## 2022-07-06 ENCOUNTER — Emergency Department
Admission: EM | Admit: 2022-07-06 | Discharge: 2022-07-06 | Disposition: A | Discharge status: Home / Self Care | Payer: Medicare Other | Attending: Emergency Medicine | Admitting: Emergency Medicine

## 2022-07-06 ENCOUNTER — Other Ambulatory Visit: Payer: Self-pay

## 2022-07-06 ENCOUNTER — Ambulatory Visit
Admission: EM | Admit: 2022-07-06 | Discharge: 2022-07-06 | Disposition: A | Discharge status: Home / Self Care | Payer: Medicare Other

## 2022-07-06 DIAGNOSIS — T839XXA Unspecified complication of genitourinary prosthetic device, implant and graft, initial encounter: Secondary | ICD-10-CM

## 2022-07-06 DIAGNOSIS — T83098A Other mechanical complication of other indwelling urethral catheter, initial encounter: Secondary | ICD-10-CM | POA: Insufficient documentation

## 2022-07-06 DIAGNOSIS — R339 Retention of urine, unspecified: Secondary | ICD-10-CM | POA: Insufficient documentation

## 2022-07-06 MED ORDER — PHENAZOPYRIDINE HCL 100 MG PO TABS: 100.0000 mg | ORAL_TABLET | Freq: Three times a day (TID) | ORAL | 0 refills | Status: AC | PRN

## 2022-07-06 NOTE — ED Triage Notes (Signed)
Patient had catheter placed at urology yesterday and reports pain throughout night and this morning in pelvic area and little drainage.   Catheter placed due to urinary retention and UTI recently.

## 2022-07-06 NOTE — ED Provider Notes (Signed)
[facility] Provider Note    Event Date/Time   First MD Initiated Contact with Patient 07/06/22 1217     (approximate)   History   No chief complaint on file.   HPI  Ernest Hutchinson is a 71 y.o. male   Past medical history of BPH status post TURP in April 2023 and urinary retention requiring Foley catheter placement yesterday by urology 16 French presents with discomfort sensation in the pelvis and decreased urine output via Foley catheter.  He had a urinalysis which was negative yesterday during urology appointment.  He denies fevers, chills, nausea or vomiting.  Feels that the urinary catheter was applied too far down his leg and is pulling on his penis when he is walking.  History was obtained via patient and review of external medical notes.      Physical Exam   Triage Vital Signs: ED Triage Vitals  Enc Vitals Group     BP 07/06/22 1144 139/89     Pulse Rate 07/06/22 1144 82     Resp 07/06/22 1144 16     Temp 07/06/22 1144 98.1 F (36.7 C)     Temp Source 07/06/22 1144 Oral     SpO2 07/06/22 1144 94 %     Weight 07/06/22 1139 163 lb (73.9 kg)     Height 07/06/22 1139 '5\' 7"'$  (1.702 m)     Head Circumference --      Peak Flow --      Pain Score 07/06/22 1139 7     Pain Loc --      Pain Edu? --      Excl. in [place]? --     Most recent vital signs: Vitals:   07/06/22 1144  BP: 139/89  Pulse: 82  Resp: 16  Temp: 98.1 F (36.7 C)  SpO2: 94%    General: Awake, no distress.  CV:  Good peripheral perfusion.  Resp:  Normal effort.  Abd:  No distention.  Tender to palpation.  Urinary catheter is in place, urethral meatus looks normal, draining yellow urine. Other:  Bedside ultrasound shows a collapsed bladder around a Foley catheter balloon.   ED Results / Procedures / Treatments   Labs (all labs ordered are listed, but only abnormal results are displayed) Labs Reviewed - No data to display   PROCEDURES:  Critical Care  performed: No  Procedures   MEDICATIONS ORDERED IN ED: Medications - No data to display   IMPRESSION / MDM / [place] / ED COURSE  I reviewed the triage vital signs and the nursing notes.                              Differential diagnosis includes, but is not limited to, acute urinary retention, urinary tract infection, irritation due to new Foley catheter, I considered but less likely intra-abdominal emergent pathology like infection or obstruction.  Foley collapsed bladder around the Foley catheter balloon, no obstruction noted, leg bag has yellow urine.  Patient otherwise appears well with some irritation to pelvic area, complains that the Foley catheter is pulling when he is walking, we will readjust upwards by several inches to get some slack.  We will trial a prescription of Pyridium for bladder spasms.  He call his urologist for a follow-up appointment.  He will return to the emergency department with any worsening.   Patient's presentation is most consistent with acute illness / injury  with system symptoms.       FINAL CLINICAL IMPRESSION(S) / ED DIAGNOSES   Final diagnoses:  Problem with urinary catheter ([place])     Rx / DC Orders   ED Discharge Orders          Ordered    phenazopyridine (PYRIDIUM) 100 MG tablet  3 times daily PRN        07/06/22 1311             Note:  This document was prepared using Dragon voice recognition software and may include unintentional dictation errors.    Lucillie Garfinkel, MD 07/06/22 1311

## 2022-07-06 NOTE — ED Notes (Addendum)
Patient opted to keep his original securing device. Patient was given another securing device to take home with instructions for use. Patient's wife emptied leg bag of urine.

## 2022-07-06 NOTE — Discharge Instructions (Addendum)
Take Pyridium as needed for bladder spasms, take as instructed. See your urologist next week as scheduled.  Thank you for choosing [place] for your health care today!  Please see your primary doctor this week for a follow up appointment.   If you do not have a primary doctor call the following clinics to establish care:  If you have insurance:  [facility] [phone] [place] [address]   [facility]  [phone] [address] [place] [address]   If you do not have insurance:  [facility]  [phone] [address] [place] [address]  Sometimes, in the early stages of certain disease courses it is difficult to detect in the emergency department evaluation -- so, it is important that you continue to monitor your symptoms and call your doctor right away or return to the emergency department if you develop any new or worsening symptoms.  It was my pleasure to care for you today.   Hoover Brunette Jacelyn Grip, MD

## 2022-07-06 NOTE — ED Notes (Signed)
Pt reports bladder scan completed by previous staff member.

## 2022-07-08 ENCOUNTER — Ambulatory Visit (INDEPENDENT_AMBULATORY_CARE_PROVIDER_SITE_OTHER): Payer: Medicare Other | Admitting: Urology

## 2022-07-08 ENCOUNTER — Ambulatory Visit: Payer: Medicare Other | Admitting: Physician Assistant

## 2022-07-08 ENCOUNTER — Encounter: Payer: Self-pay | Admitting: Urology

## 2022-07-08 VITALS — BP 112/73 | HR 102 | Ht 67.0 in | Wt 165.0 lb

## 2022-07-08 DIAGNOSIS — N401 Enlarged prostate with lower urinary tract symptoms: Secondary | ICD-10-CM

## 2022-07-08 DIAGNOSIS — R339 Retention of urine, unspecified: Secondary | ICD-10-CM | POA: Diagnosis not present

## 2022-07-08 LAB — BLADDER SCAN AMB NON-IMAGING: Scan Result: 11

## 2022-07-08 NOTE — Progress Notes (Unsigned)
Catheter Removal  Patient is present today for a catheter removal.  87m of water was drained from the balloon. A 14coude FR foley cath was removed from the bladder, no complications were noted. Patient tolerated well.  Performed by: JGaspar ColaCma  Follow up/ Additional notes: This afternoon

## 2022-07-08 NOTE — Progress Notes (Unsigned)
   07/08/22  CC:  Chief Complaint  Patient presents with   Cysto    HPI: Refer to my prior office note 07/05/2022.  Patient presented to ED 07/06/2022 with complaints of urge to void and decreased urine output through his Foley catheter.  He was noted to have yellow urine in the catheter bag with no evidence of an obstructed catheter.  He states he has had persistent symptoms of feeling like he needs to void  Blood pressure 112/73, pulse (!) 102, height '5\' 7"'$  (1.702 m), weight 165 lb (74.8 kg). NED. A&Ox3.   No respiratory distress   Abd soft, NT, ND Normal phallus with bilateral descended testicles  Cystoscopy Procedure Note  Patient identification was confirmed, informed consent was obtained, and patient was prepped using Betadine solution.  Lidocaine jelly was administered per urethral meatus.     Pre-Procedure: - Inspection reveals a normal caliber urethral meatus.  Procedure: The flexible cystoscope was introduced without difficulty - No urethral strictures/lesions are present. - Open prostatic fossa without bladder neck contracture -  Open  bladder neck - Bilateral ureteral orifices identified - Bladder mucosa  reveals no ulcers, tumors, or lesions - No bladder stones - No trabeculation  Retroflexion shows no abnormalities   Post-Procedure: - Patient tolerated the procedure well  Assessment/ Plan: No evidence of urethral stricture/bladder neck contracture Open prostatic fossa Foley catheter was not replaced.  He will follow-up this afternoon for a PVR    Abbie Sons, MD

## 2022-07-09 ENCOUNTER — Encounter: Payer: Self-pay | Admitting: Urology

## 2022-07-12 ENCOUNTER — Ambulatory Visit: Payer: Self-pay | Admitting: Urology

## 2022-11-13 IMAGING — CT CT ANGIO CHEST
2 of 7 series · 18 of 46 positions shown · IV contrast (APPLIED)
Comparison: Chest radiograph dated 09/12/2021.

CLINICAL DATA: Concern for pulmonary embolism.

EXAM:
CT ANGIOGRAPHY CHEST WITH CONTRAST
TECHNIQUE: Multidetector CT imaging of the chest was performed using the
standard protocol during bolus administration of intravenous
contrast. Multiplanar CT image reconstructions and MIPs were
obtained to evaluate the vascular anatomy.
CONTRAST:  75mL OMNIPAQUE IOHEXOL 350 MG/ML SOLN

[Series 5: thins · axial · 0.76mm/px · z∈[-280,-44]mm · 15 of 329 slices shown]
[im 17/329  lung]
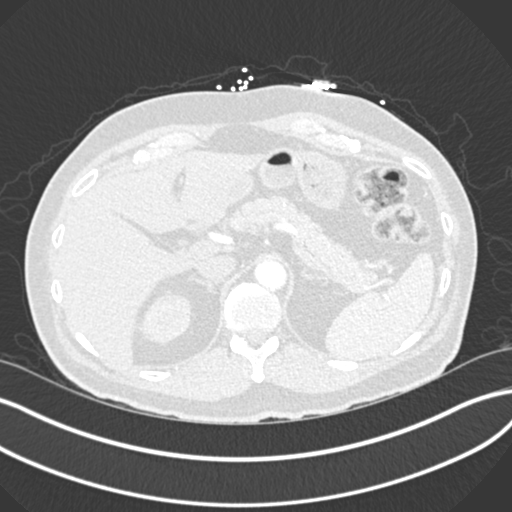
[im 33/329  soft-tissue]
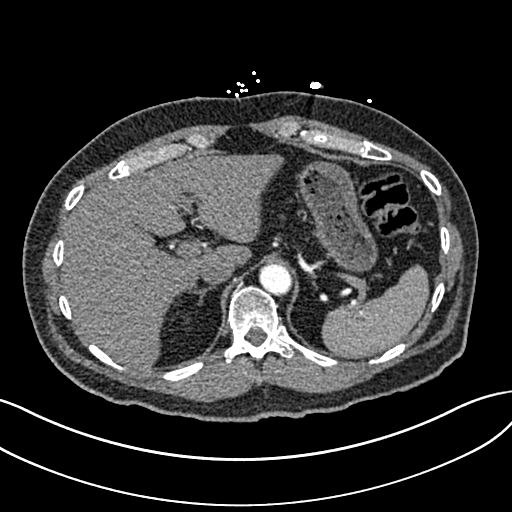
[im 66/329  lung]
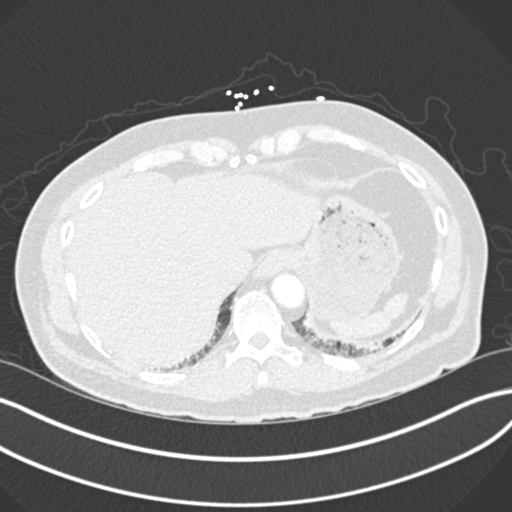
[im 83/329  soft-tissue]
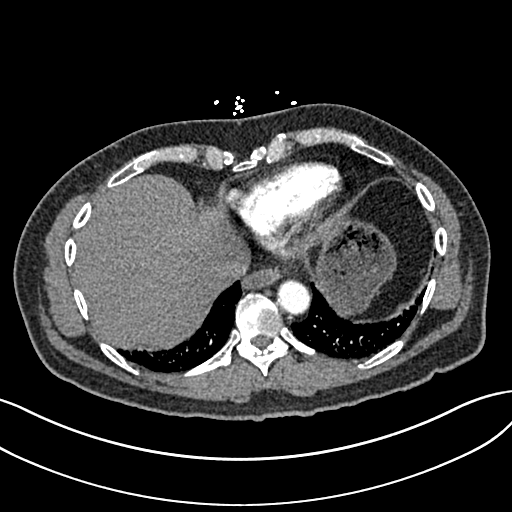
[im 99/329  lung]
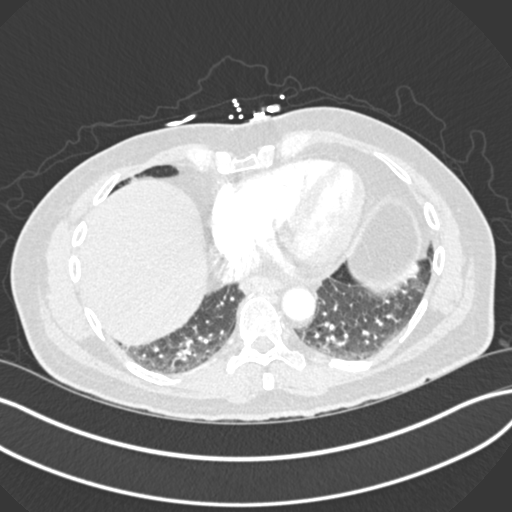
[im 115/329  soft-tissue]
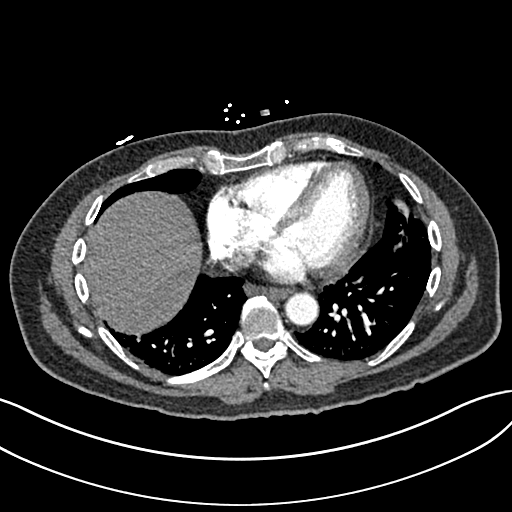
[im 148/329  lung]
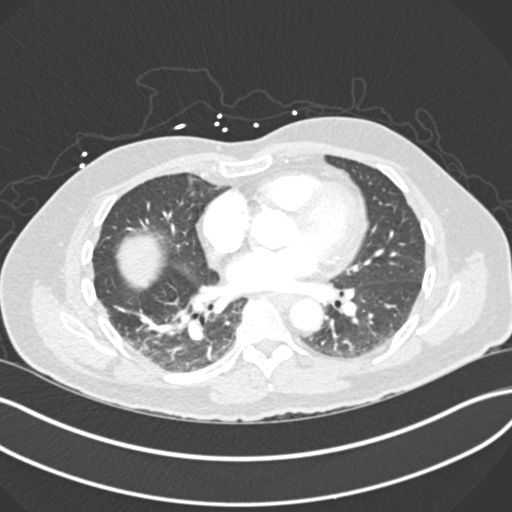
[im 165/329  soft-tissue]
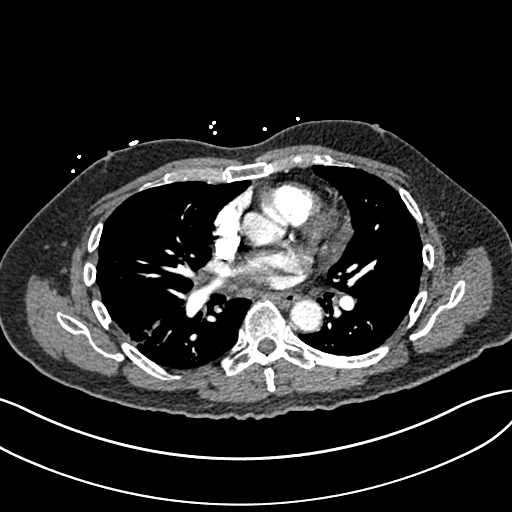
[im 181/329  lung]
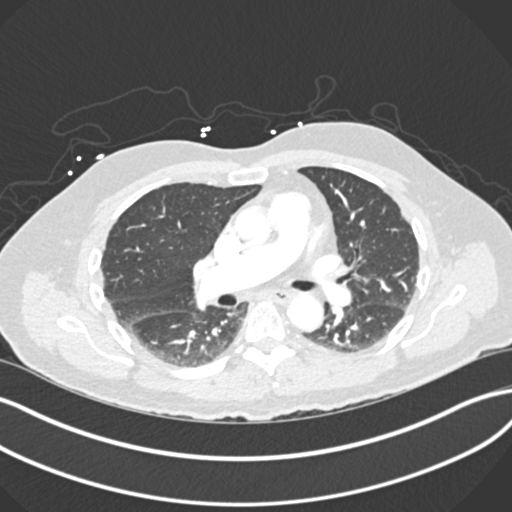
[im 214/329  soft-tissue]
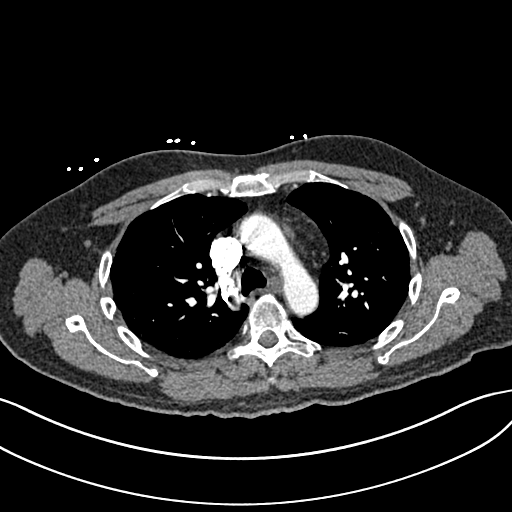
[im 230/329  lung]
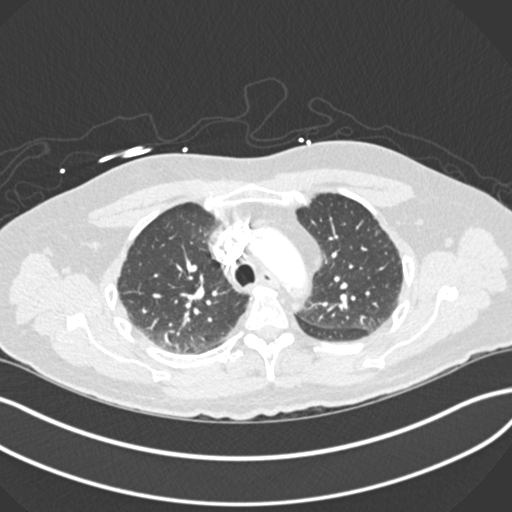
[im 246/329  soft-tissue]
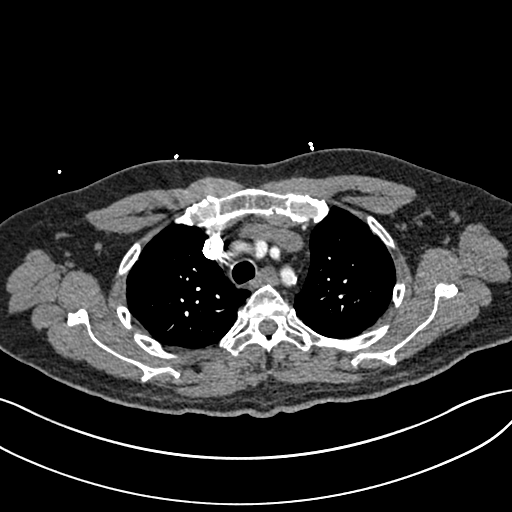
[im 263/329  lung]
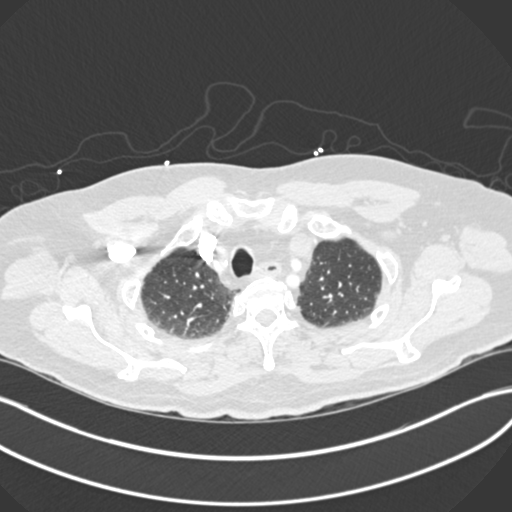
[im 296/329  soft-tissue]
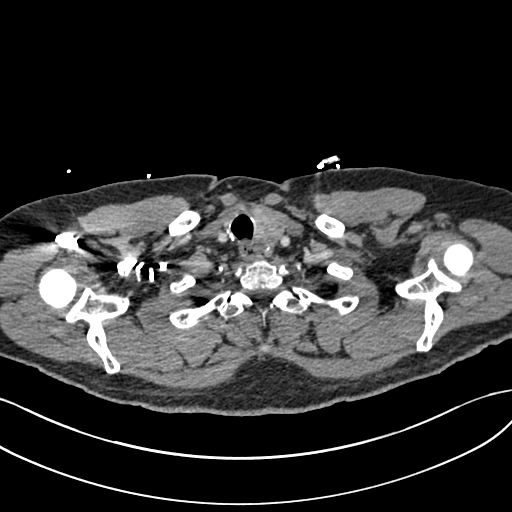
[im 312/329  lung]
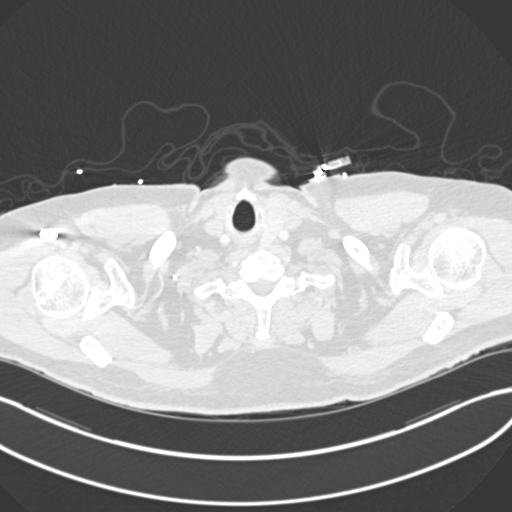

[Series 7: coronal mpr · coronal · 0.52mm/px · 3 of 79 slices shown]
[im 20/79  soft-tissue]
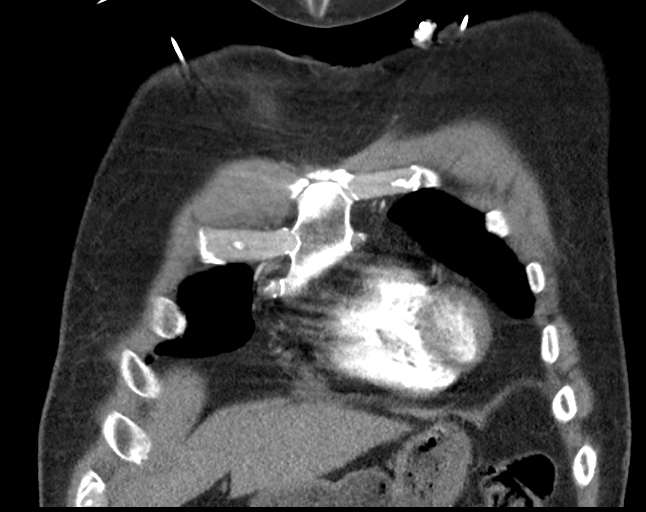
[im 40/79  soft-tissue]
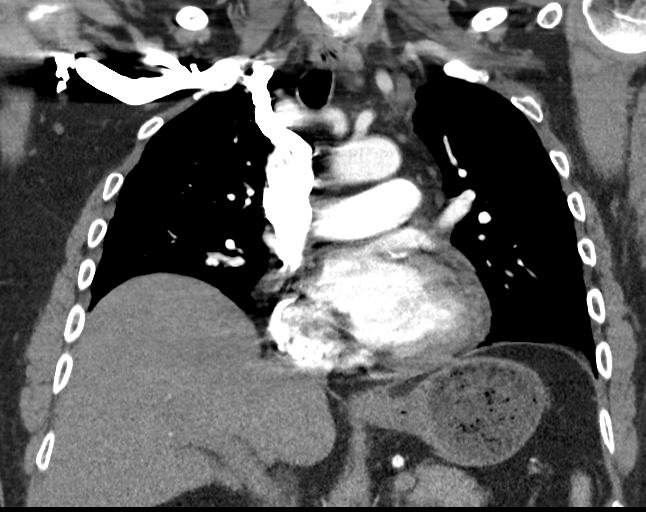
[im 59/79  soft-tissue]
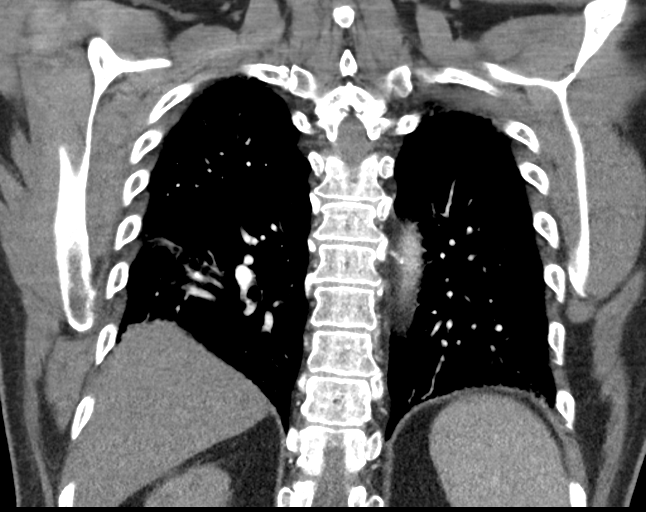

[18 of 46 positions shown; findings below may reference images not displayed]

FINDINGS: Cardiovascular: There is no cardiomegaly or pericardial effusion.
Coronary vascular calcification. Mild atherosclerotic calcification
of the thoracic aorta. No aneurysmal dilatation or dissection. The
origins of the great vessels of the aortic arch appear patent as
visualized. Evaluation of the pulmonary arteries is limited due to
respiratory motion artifact and suboptimal visualization of the
peripheral branches. No pulmonary artery embolus identified.

Mediastinum/Nodes: There is no hilar or mediastinal adenopathy. The
esophagus is grossly unremarkable. Status post prior right
hemithyroidectomy. The left thyroid gland is heterogeneous with
multiple small ill-defined hypodense nodules which appear to measure
up to 15 mm. Recommend thyroid US (ref: [HOSPITAL]. [DATE]): 143-50).No mediastinal fluid collection.

Lungs/Pleura: No focal consolidation, pleural effusion,
pneumothorax. The central airways are patent.

Upper Abdomen: No acute abnormality.

Musculoskeletal: No acute osseous pathology.

Review of the MIP images confirms the above findings.
IMPRESSION: 1. No acute intrathoracic pathology. No CT evidence of pulmonary
embolism.
2. Heterogeneous left thyroid gland with multiple nodules. Further
evaluation with ultrasound on a nonemergent/outpatient basis
recommended.
3. Aortic Atherosclerosis ([id number]).

## 2022-11-13 IMAGING — DX DG CHEST 1V PORT
1 series · 1 of 1 positions shown · non-contrast
Comparison: None.

CLINICAL DATA: Shortness of breath and fevers

EXAM:
PORTABLE CHEST 1 VIEW

[chest ap]
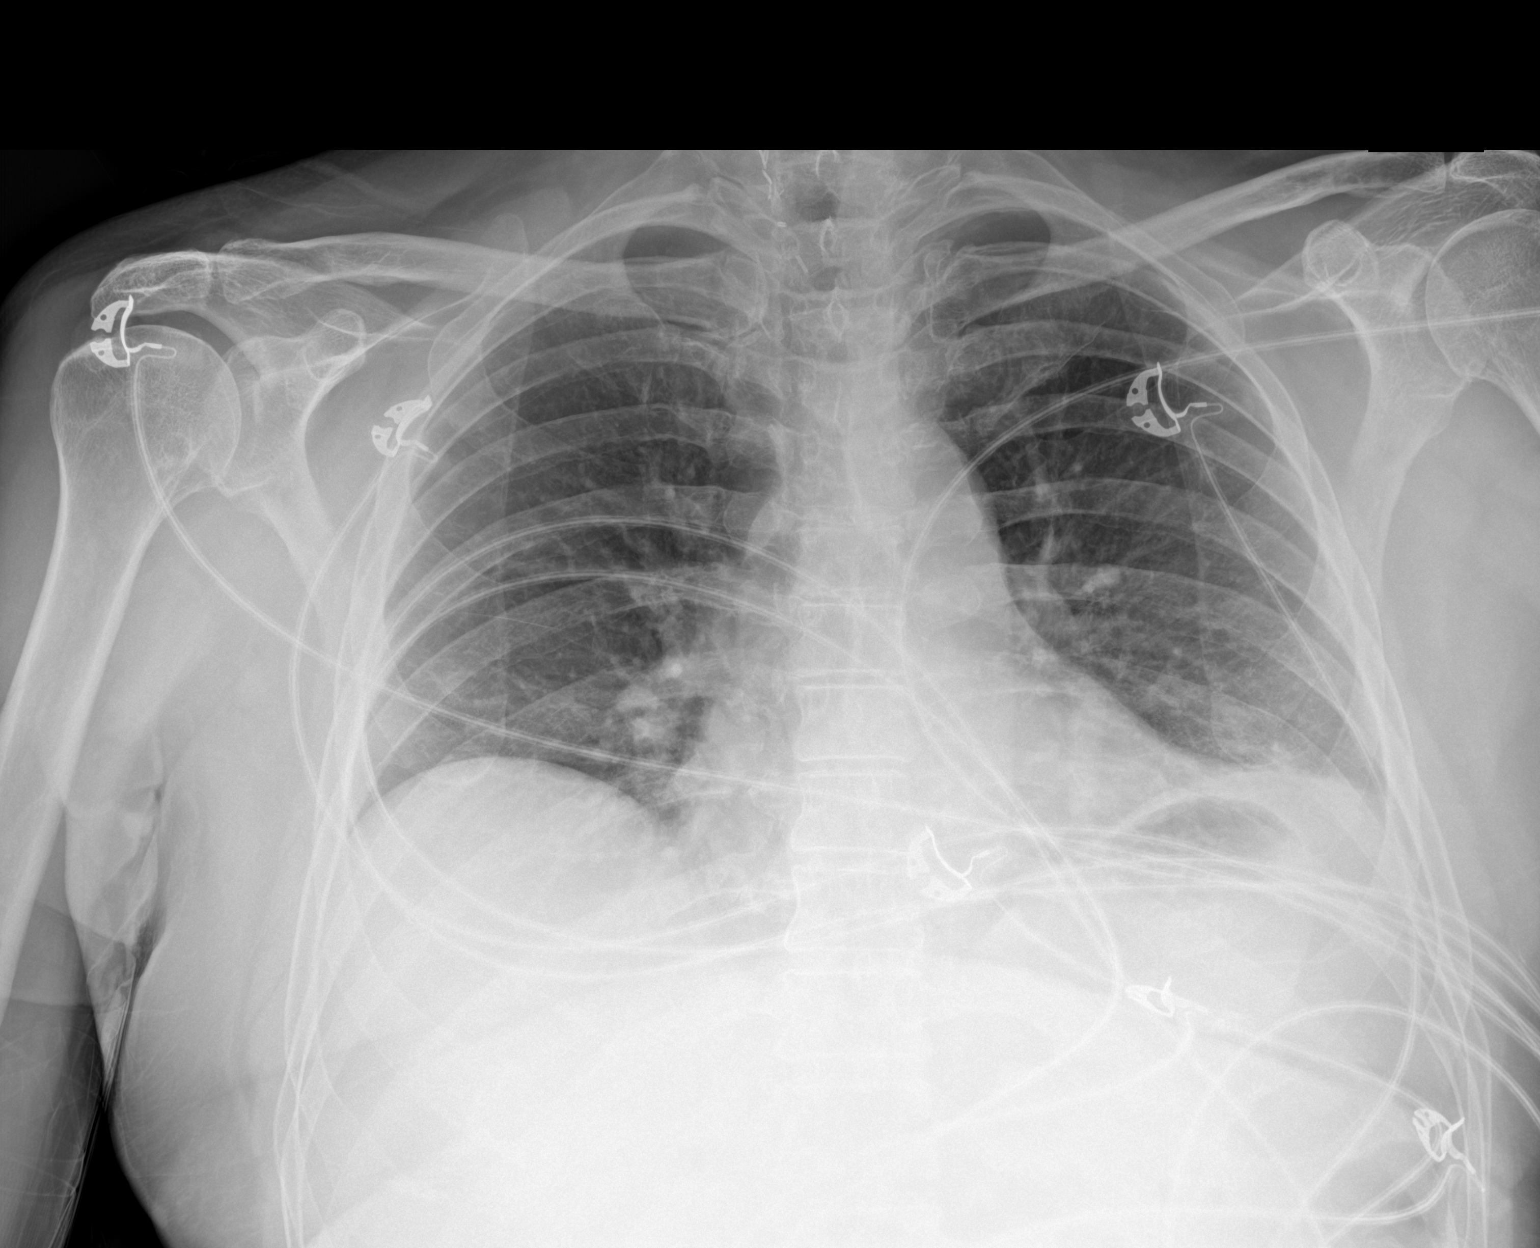

[1 of 1 positions shown; findings below may reference images not displayed]

FINDINGS: Cardiac shadow is within normal limits. Lungs are well aerated
bilaterally. Minimal left basilar atelectasis is seen. No bony
abnormality is noted.
IMPRESSION: Minimal left basilar atelectasis.

## 2022-11-15 IMAGING — US US RENAL
1 series · 14 of 25 positions shown · non-contrast
Comparison: None.

CLINICAL DATA: UTI

EXAM:
RENAL / URINARY TRACT ULTRASOUND COMPLETE

[Series 1: us renal · 14 of 45 slices shown]
[im 1/45]
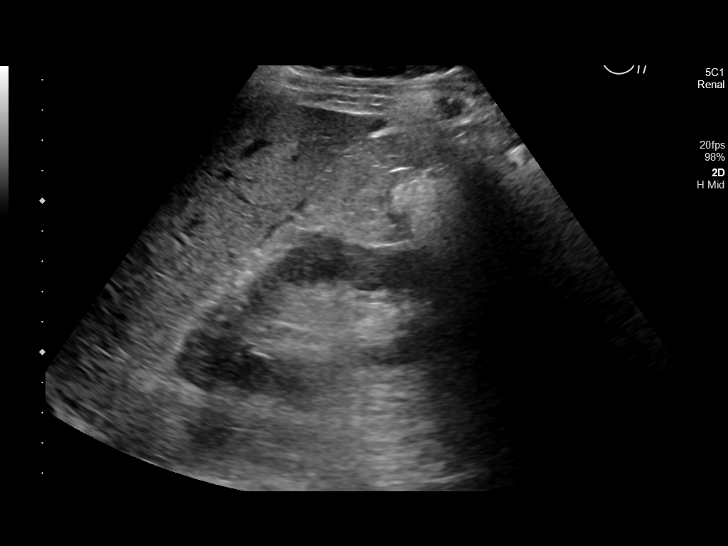
[im 4/45]
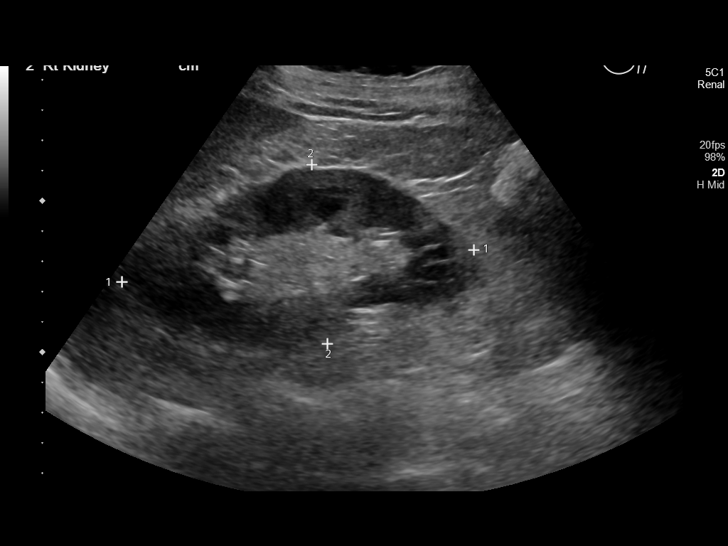
[im 8/45]
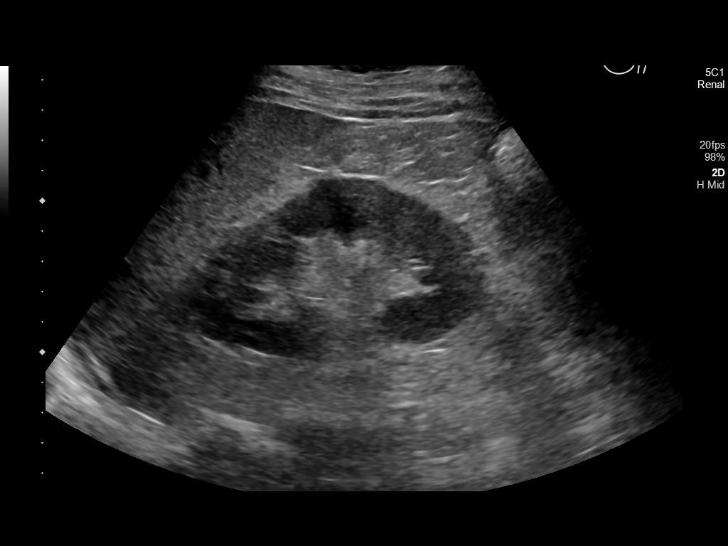
[im 12/45]
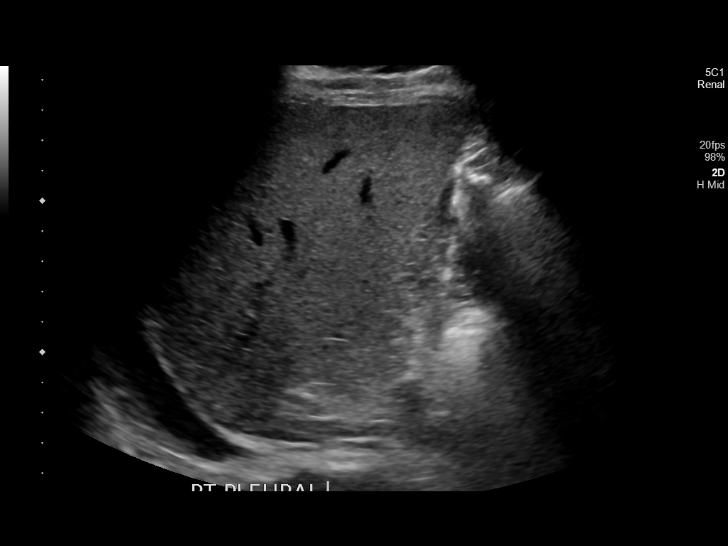
[im 15/45]
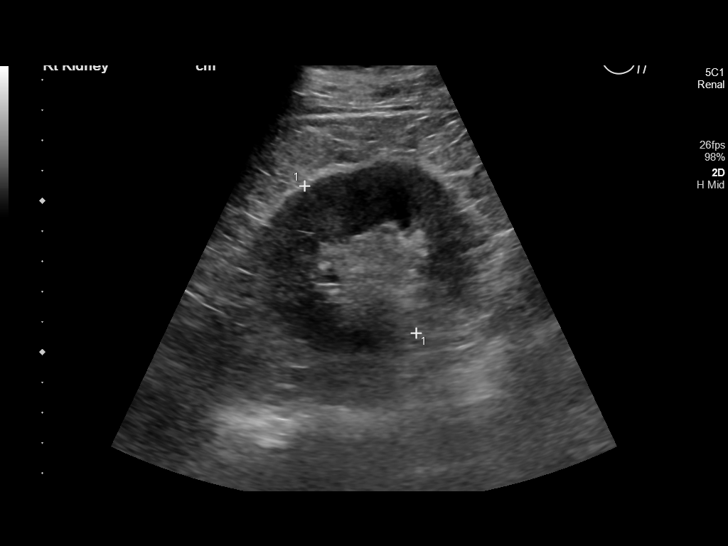
[im 17/45]
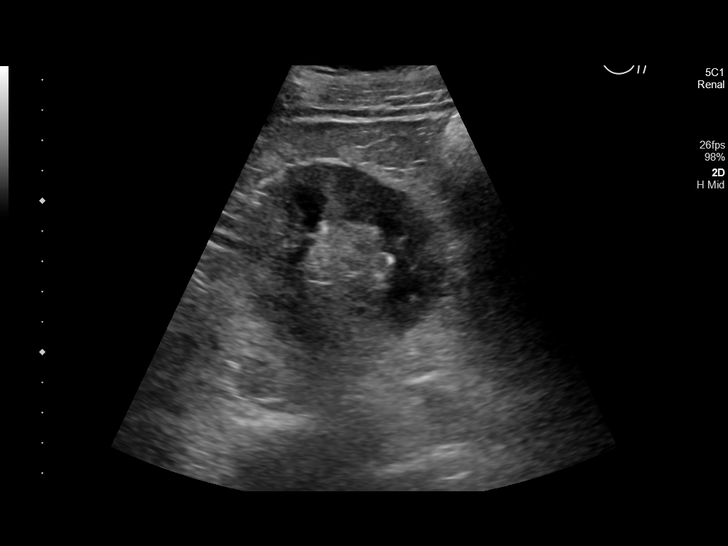
[im 21/45]
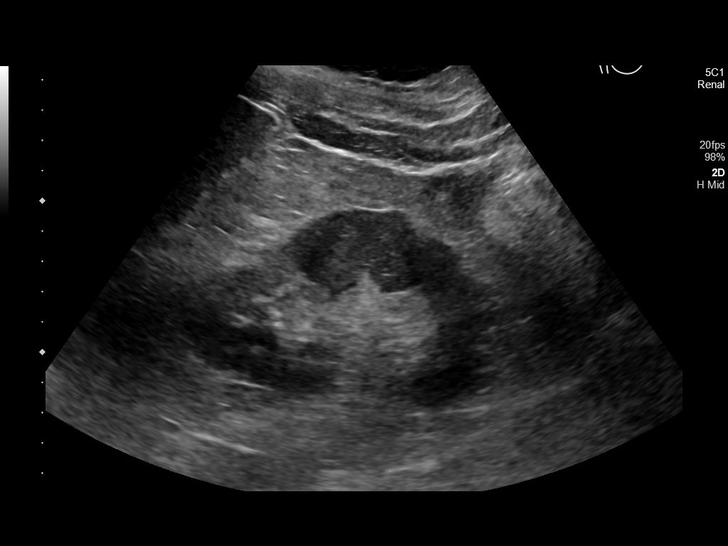
[im 24/45]
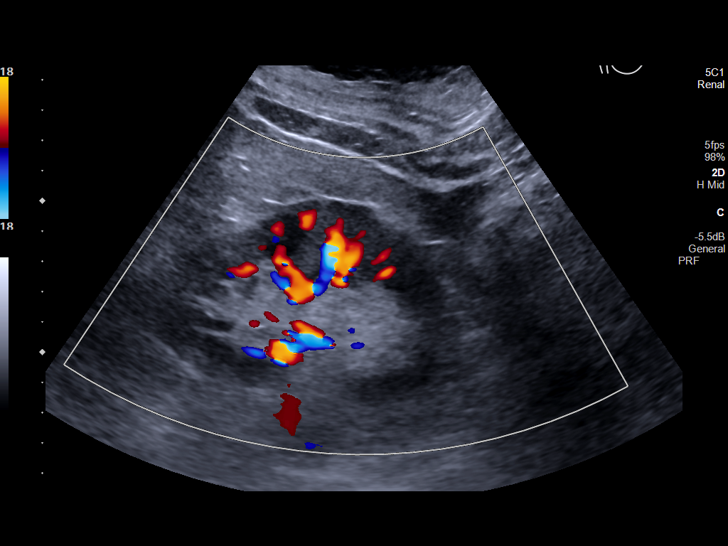
[im 28/45]
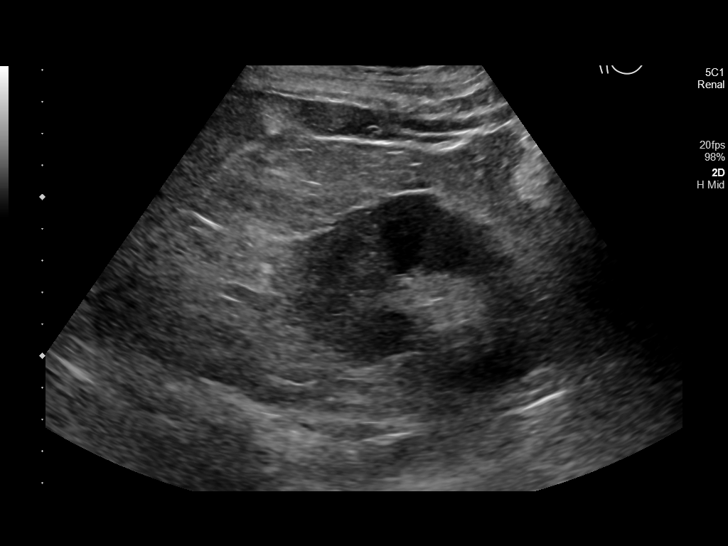
[im 30/45]
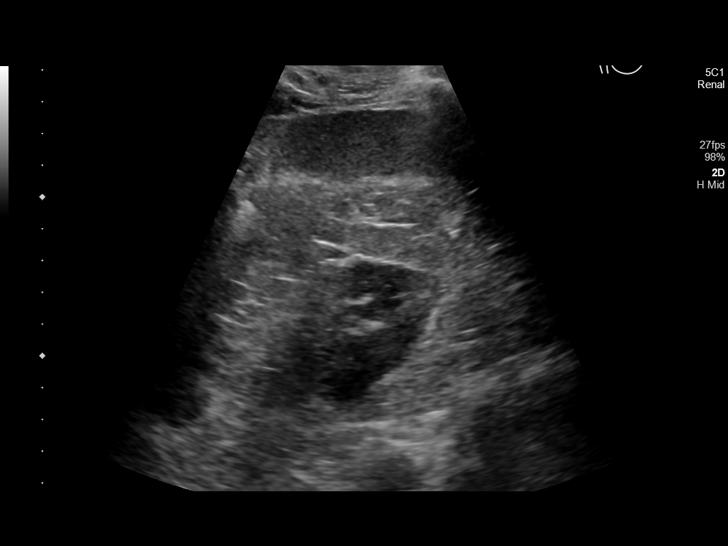
[im 34/45]
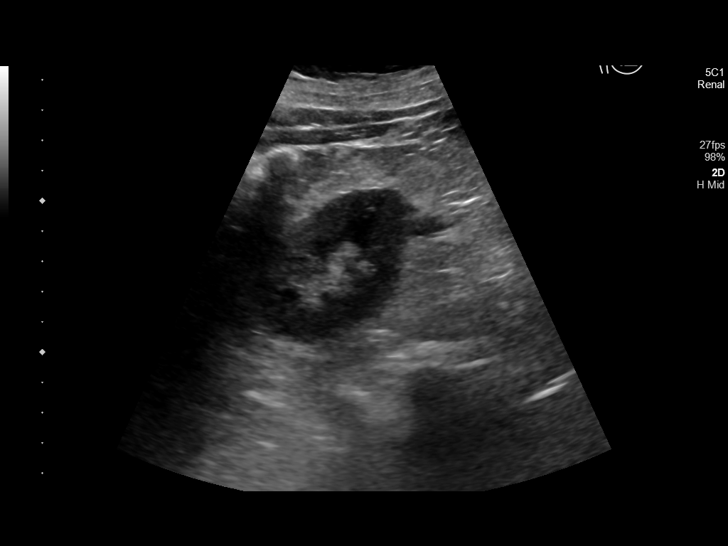
[im 37/45]
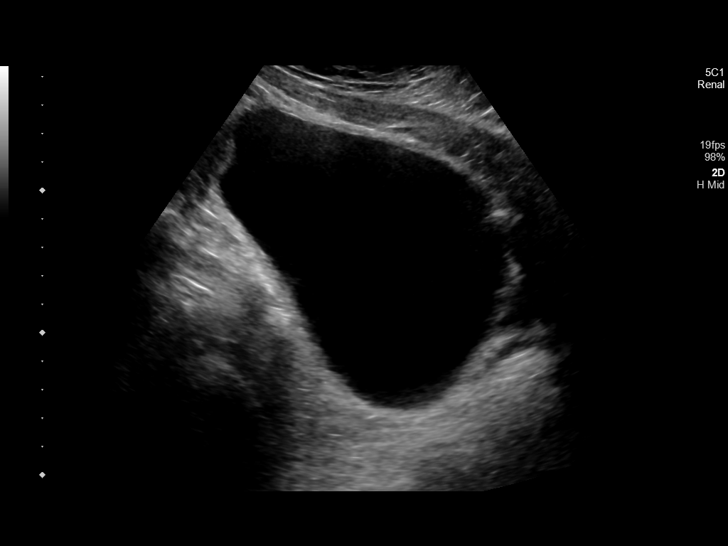
[im 41/45]
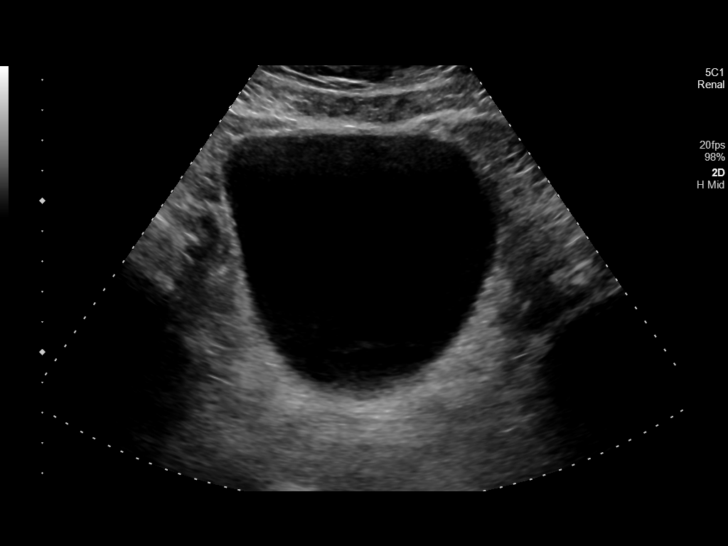
[im 45/45]
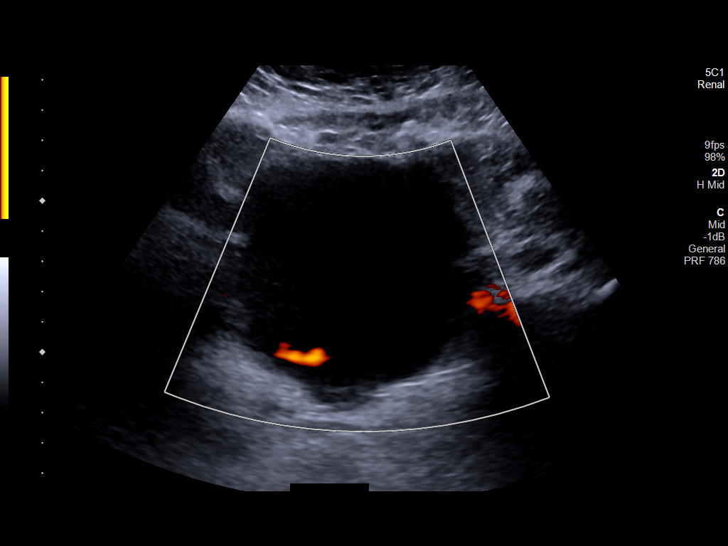

[14 of 25 positions shown; findings below may reference images not displayed]

FINDINGS: Right Kidney:

Renal measurements: 11.7 x 5.9 x 6.1 cm = volume: 221.6 mL.
Technologist observed possible small scattered vascular
calcifications. There is no hydronephrosis or perinephric fluid
collection. Cortical echogenicity is unremarkable.

Left Kidney:

Renal measurements: 10.7 x 6.5 x 5.2 cm = volume: 188.3 mL. There is
no hydronephrosis or perinephric fluid collection. There is no focal
cortical thinning.

Bladder:

Unremarkable. Ureteral jets are observed at both ureterovesical
junctions.

Other:

None.
IMPRESSION: There is no hydronephrosis

## 2023-01-28 ENCOUNTER — Ambulatory Visit (INDEPENDENT_AMBULATORY_CARE_PROVIDER_SITE_OTHER): Payer: Medicare Other | Admitting: Physician Assistant

## 2023-01-28 ENCOUNTER — Telehealth: Payer: Self-pay | Admitting: Physician Assistant

## 2023-01-28 VITALS — BP 97/69 | HR 86 | Ht 67.0 in | Wt 165.0 lb

## 2023-01-28 DIAGNOSIS — R339 Retention of urine, unspecified: Secondary | ICD-10-CM

## 2023-01-28 DIAGNOSIS — N390 Urinary tract infection, site not specified: Secondary | ICD-10-CM | POA: Diagnosis not present

## 2023-01-28 DIAGNOSIS — T839XXA Unspecified complication of genitourinary prosthetic device, implant and graft, initial encounter: Secondary | ICD-10-CM | POA: Diagnosis not present

## 2023-01-28 LAB — BLADDER SCAN AMB NON-IMAGING: Scan Result: 41 ml

## 2023-01-28 NOTE — Telephone Encounter (Signed)
Pt started the medication,doxycyline 01/20/2023 2 times a day. Stop taking it yesterday, 4/8.  Went to [facility] last Saturday 4/6 E /R.  If  you don't need anything else no need to call [phone].

## 2023-01-28 NOTE — Progress Notes (Signed)
01/28/2023 3:22 PM   Ernest Hutchinson [birth year] [id number]  CC: Chief Complaint  Patient presents with   Follow-up   HPI: Ernest Hutchinson is a 72 y.o. male with PMH BPH s/p TURP at [facility] in 2023 and prostatitis who recently developed urinary retention in the setting of UTI requiring Foley catheter placement at the [facility] emergency department 3 days ago who presents today for evaluation of Foley catheter problems.  He first reached out to his PCP 8 days ago with reports of 1 day of dysuria.  He started empiric doxycycline and then presented to the [facility] ED 3 days ago with reports of new urinary frequency and low volume voids.  PVR 373 mL.  Foley catheter was placed.  His urine culture from 8 days ago with his PCP has finalized with multidrug-resistant Klebsiella, susceptible to tetracycline and Levaquin. He took ~7 days of doxycycline before switching to Levaquin yesterday per Dr. Hyacinth Meeker. Levaquin has been prescribed for 7 days.  Today he reports urine leakage around his catheter as well as catheter tugging/pulling with ambulation. Bladder scan with 71mL.  PMH: Past Medical History:  Diagnosis Date   Arthritis    BPH (benign prostatic hyperplasia)    Complication of anesthesia 2021   after knee replacement, pt had delerium   COVID-19    Diverticulitis    GERD (gastroesophageal reflux disease)    H. pylori infection    History of kidney stones    Hyperlipemia    Hypertension    Hyperthyroidism    Sepsis (HCC) 2022   Thyroid disease    Thyroid nodule    Vitamin B12 deficiency     Surgical History: Past Surgical History:  Procedure Laterality Date   CARPAL TUNNEL RELEASE Bilateral    COLONOSCOPY     x2   HERNIA REPAIR     HYDROCELE EXCISION     REPLACEMENT TOTAL KNEE Left    THYROIDECTOMY, PARTIAL     TONSILLECTOMY     TOTAL KNEE REVISION Left 05/01/2022   Procedure: TOTAL KNEE REVISION;  Surgeon: Donato Heinz, MD;  Location: [facility] ORS;  Service:  Orthopedics;  Laterality: Left;   TRANSURETHRAL RESECTION OF PROSTATE  01/30/2022   at [facility] Medications:  Allergies as of 01/28/2023       Reactions   Ceftin [cefuroxime] Anaphylaxis   Cefuroxime Axetil Anaphylaxis   Chlorhexidine Gluconate Itching        Medication List        Accurate as of January 28, 2023  3:22 PM. If you have any questions, ask your nurse or doctor.          atenolol 50 MG tablet Commonly known as: TENORMIN Take 50 mg by mouth daily. In morning   Cranberry 500 MG Caps Take 1 capsule by mouth 2 (two) times daily.   cyanocobalamin 1000 MCG tablet Commonly known as: VITAMIN B12 Take 1,000 mcg by mouth daily.   diazepam 5 MG tablet Commonly known as: VALIUM Take 5-7.5 mg by mouth at bedtime as needed.   finasteride 5 MG tablet Commonly known as: PROSCAR Take 5 mg by mouth daily. In morning   gabapentin 300 MG capsule Commonly known as: NEURONTIN Take 300 mg by mouth 2 (two) times daily. At bedtime   lisinopril 5 MG tablet Commonly known as: ZESTRIL Take 5 mg by mouth at bedtime.   rosuvastatin 20 MG tablet Commonly known as: CRESTOR Take 20 mg by mouth at bedtime.  SYSTANE OP Apply 1 drop to eye as needed.   tamsulosin 0.4 MG Caps capsule Commonly known as: FLOMAX Take 0.4 mg by mouth in the morning and at bedtime.   traMADol 50 MG tablet Commonly known as: ULTRAM Take 1 tablet (50 mg total) by mouth every 4 (four) hours as needed for moderate pain.   Vitamin D-3 125 MCG (5000 UT) Tabs Take 1 tablet by mouth daily at 6 (six) AM.        Allergies:  Allergies  Allergen Reactions   Ceftin [Cefuroxime] Anaphylaxis   Cefuroxime Axetil Anaphylaxis   Chlorhexidine Gluconate Itching    Family History: No family history on file.  Social History:   reports that he quit smoking about 39 years ago. His smoking use included cigarettes. He has a 30.00 pack-year smoking history. He has never used smokeless tobacco. He  reports current drug use. He reports that he does not drink alcohol.  Physical Exam: BP 97/69   Pulse 86   Ht 5\' 7"  (1.702 m)   Wt 165 lb (74.8 kg)   BMI 25.84 kg/m   Constitutional:  Alert and oriented, no acute distress, nontoxic appearing HEENT: [place], AT Cardiovascular: No clubbing, cyanosis, or edema Respiratory: Normal respiratory effort, no increased work of breathing Skin: No rashes, bruises or suspicious lesions Neurologic: Grossly intact, no focal deficits, moving all 4 extremities Psychiatric: Normal mood and affect  Laboratory Data: Results for orders placed or performed in visit on 07/08/22  Bladder Scan (Post Void Residual) in office  Result Value Ref Range   Scan Result 11 ml    Cath Change/ Replacement  Patient is present today for a catheter change due to urinary retention.  38ml of water was removed from the balloon, a 16FR coude silicone foley cath was removed without difficulty.  Patient was cleaned and prepped in a sterile fashion with betadine and 2% lidocaine jelly was instilled into the urethra. A 16 FR coude foley cath was replaced into the bladder, no complications were noted. Urine return was noted 9ml and urine was yellow in color. The balloon was filled with 20ml of sterile water. A leg bag was attached for drainage.  Patient tolerated well.    Performed by: Carman Ching, PA-C   Assessment & Plan:   1. Recurrent UTI On culture appropriate abx. I counseled him and his wife via telephone to reach out if having any persistent pelvic discomfort on the last day of his Levaquin rx, at which point will extend another 2 weeks for possible underlying prostatitis.  2. Problem with Foley catheter, initial encounter Silicone Foley taut on lateral StatLock today. I offered him Foley replacement and repositioning and he accepted, see note above. We discussed urine leakage may represent bladder spasms but recommend deferring pharmacotherapy unless they are  uncomfortable due to concern meds may decrease his ability to pass upcoming voiding trial. - BLADDER SCAN AMB NON-IMAGING   Return for Keep voiding trial as scheduled.  Carman Ching, PA-C  [facility] [place] [address], [address] [place], [place] [address] [phone]

## 2023-01-30 ENCOUNTER — Ambulatory Visit (INDEPENDENT_AMBULATORY_CARE_PROVIDER_SITE_OTHER): Payer: Medicare Other | Admitting: Urology

## 2023-01-30 DIAGNOSIS — N4889 Other specified disorders of penis: Secondary | ICD-10-CM

## 2023-01-30 DIAGNOSIS — N390 Urinary tract infection, site not specified: Secondary | ICD-10-CM | POA: Diagnosis not present

## 2023-01-30 DIAGNOSIS — R339 Retention of urine, unspecified: Secondary | ICD-10-CM

## 2023-01-30 DIAGNOSIS — N481 Balanitis: Secondary | ICD-10-CM

## 2023-01-30 MED ORDER — NYSTATIN-TRIAMCINOLONE 100000-0.1 UNIT/GM-% EX CREA: 1.0000 | TOPICAL_CREAM | Freq: Two times a day (BID) | CUTANEOUS | 0 refills | Status: AC

## 2023-01-30 NOTE — Progress Notes (Signed)
01/30/2023 9:12 PM   Ernest Hutchinson 1951/09/28 [id number]  Referring provider: Danella Penton, MD [phone] [facility] [address] [facility] [place],  [place] [address]  Urological history: 1. BPH with LU TS -PSA (08/2022) 0.10 -s/p TURP (01/2022)  -cysto (06/2022) - open prostatic fossa w/o bladder neck contracture and an open bladder neck   2. rUTI's -contributing factors of age, BPH,   Chief Complaint  Patient presents with   Follow-up    HPI: Ernest Hutchinson is a 72 y.o. male who presents today for issues with Foley catheter with his wife, Ernest Hutchinson.    He contacted his PCP on 01/20/2023 for symptoms of burning with urination for one day.  He self started on doxycycline that he had at home.   His UA was colorless, clear, specific gravity 1.007, pH 6.5, 2+ Leuks, 21 WBC's, 1 RBC's and 0-5 bacteria.  Urine culture was positive for Klebsiella pneumoniae.  He was continued on doxycycline.  He was also on a short course of Levaquin.   He then sought treatment at [facility] ED for frequency and poor urinary output on 01/26/2023. The ED notes state that there was a bladder scan volume of 279 mL with  PVR 373 mL.  UA light yellow clear, specific gravity 1.012, pH 6.5, large leuks, 3 RBC's, 42 WBC's, 1 squame, occasional bacteria and rare mucus.  Urine culture positive for Klebsiella pneumonia.    He then followed up with Hilton Sinclair, PA-C and was reporting urine leakage around his catheter as well as catheter tugging/pulling with ambulation.   His catheter was repositioned and he was instructed to keep his TOV appointment with Dr. Lonna Cobb on the 17th.  Today, he is continuing to have penile burning.  Patient denies any modifying or aggravating factors.  Patient denies any gross hematuria or suprapubic/flank pain.  Patient denies any fevers, chills, nausea or vomiting.  His catheter is in place and it is draining yellow clear urine.    His UA yellow hazy,  specific gravity >1.030, pH 6.5, 1+ leuks, 1+ protein, 3+ heme, 6-10 WBC's, > 30 RBC's, 0-10 epithelial cells and moderate bacteria.    PMH: Past Medical History:  Diagnosis Date   Arthritis    BPH (benign prostatic hyperplasia)    Complication of anesthesia 2021   after knee replacement, pt had delerium   COVID-19    Diverticulitis    GERD (gastroesophageal reflux disease)    H. pylori infection    History of kidney stones    Hyperlipemia    Hypertension    Hyperthyroidism    Sepsis (HCC) 2022   Thyroid disease    Thyroid nodule    Vitamin B12 deficiency     Surgical History: Past Surgical History:  Procedure Laterality Date   CARPAL TUNNEL RELEASE Bilateral    COLONOSCOPY     x2   HERNIA REPAIR     HYDROCELE EXCISION     REPLACEMENT TOTAL KNEE Left    THYROIDECTOMY, PARTIAL     TONSILLECTOMY     TOTAL KNEE REVISION Left 05/01/2022   Procedure: TOTAL KNEE REVISION;  Surgeon: Donato Heinz, MD;  Location: [facility] ORS;  Service: Orthopedics;  Laterality: Left;   TRANSURETHRAL RESECTION OF PROSTATE  01/30/2022   at [facility] Medications:  Allergies as of 01/30/2023       Reactions   Ceftin [cefuroxime] Anaphylaxis   Cefuroxime Axetil Anaphylaxis   Chlorhexidine Gluconate Itching  Medication List        Accurate as of January 30, 2023 11:59 PM. If you have any questions, ask your nurse or doctor.          atenolol 50 MG tablet Commonly known as: TENORMIN Take 50 mg by mouth daily. In morning   Cranberry 500 MG Caps Take 1 capsule by mouth 2 (two) times daily.   cyanocobalamin 1000 MCG tablet Commonly known as: VITAMIN B12 Take 1,000 mcg by mouth daily.   diazepam 5 MG tablet Commonly known as: VALIUM Take 5-7.5 mg by mouth at bedtime as needed.   finasteride 5 MG tablet Commonly known as: PROSCAR Take 5 mg by mouth daily. In morning   gabapentin 300 MG capsule Commonly known as: NEURONTIN Take 300 mg by mouth 2 (two) times daily. At  bedtime   lisinopril 5 MG tablet Commonly known as: ZESTRIL Take 5 mg by mouth at bedtime.   nystatin-triamcinolone cream Commonly known as: MYCOLOG II Apply 1 Application topically 2 (two) times daily.   rosuvastatin 20 MG tablet Commonly known as: CRESTOR Take 20 mg by mouth at bedtime.   SYSTANE OP Apply 1 drop to eye as needed.   tamsulosin 0.4 MG Caps capsule Commonly known as: FLOMAX Take 0.4 mg by mouth in the morning and at bedtime.   traMADol 50 MG tablet Commonly known as: ULTRAM Take 1 tablet (50 mg total) by mouth every 4 (four) hours as needed for moderate pain.   Vitamin D-3 125 MCG (5000 UT) Tabs Take 1 tablet by mouth daily at 6 (six) AM.        Allergies:  Allergies  Allergen Reactions   Ceftin [Cefuroxime] Anaphylaxis   Cefuroxime Axetil Anaphylaxis   Chlorhexidine Gluconate Itching    Family History: No family history on file.  Social History:  reports that he quit smoking about 39 years ago. His smoking use included cigarettes. He has a 30.00 pack-year smoking history. He has never used smokeless tobacco. He reports current drug use. He reports that he does not drink alcohol.  ROS: Pertinent ROS in HPI  Physical Exam: Constitutional:  Well nourished. Alert and oriented, No acute distress. HEENT:  AT, moist mucus membranes.  Trachea midline Cardiovascular: No clubbing, cyanosis, or edema. Respiratory: Normal respiratory effort, no increased work of breathing. Neurologic: Grossly intact, no focal deficits, moving all 4 extremities. Psychiatric: Normal mood and affect.  Laboratory Data: Results for orders placed or performed in visit on 01/30/23  CULTURE, URINE COMPREHENSIVE   Specimen: Urine   UR  Result Value Ref Range   Urine Culture, Comprehensive Preliminary report    Organism ID, Bacteria Comment   Microscopic Examination   Urine  Result Value Ref Range   WBC, UA 6-10 (A) 0 - 5 /hpf   RBC, Urine >30 (A) 0 - 2 /hpf    Epithelial Cells (non renal) 0-10 0 - 10 /hpf   Bacteria, UA Moderate (A) None seen/Few  Urinalysis, Complete  Result Value Ref Range   Specific Gravity, UA >1.030 (H) 1.005 - 1.030   pH, UA 6.5 5.0 - 7.5   Color, UA Yellow Yellow   Appearance Ur Hazy (A) Clear   Leukocytes,UA 1+ (A) Negative   Protein,UA 1+ (A) Negative/Trace   Glucose, UA Negative Negative   Ketones, UA Negative Negative   RBC, UA 3+ (A) Negative   Bilirubin, UA Negative Negative   Urobilinogen, Ur 0.2 0.2 - 1.0 mg/dL   Nitrite, UA Negative Negative  Microscopic Examination See below:   Serum creatinine (01/2023) 1.23, eGFR 63  I have reviewed the labs.   Pertinent Imaging: N/A   Assessment & Plan:    1. UTI -UA obtained from an indwelling Foley -urine sent for culture  -may need to proceed to upper tract imaging if UTI does not resolve  2. Urinary retention -Foley in place -we had a long discussion regarding whether or not risking an ED visit tonight if we take the catheter out this afternoon because of the penile burning or continuing the Foley over the weekend and we will see him on Monday for TOV - he wanted his UA rechecked before he made his decision and decided on following up in Monday  -move voiding trial up to Monday  Return for TOV on Monday .  These notes generated with voice recognition software. I apologize for typographical errors.  Cloretta NedSHANNON Arlinda Barcelona, PA-C  [facility] [address]  [address] [place], [place] [id number] [phone]  I spent 30 minutes on the day of the encounter to include pre-visit record review, face-to-face time with the patient, and post-visit ordering of tests.

## 2023-01-31 ENCOUNTER — Ambulatory Visit (INDEPENDENT_AMBULATORY_CARE_PROVIDER_SITE_OTHER): Payer: Medicare Other | Admitting: Physician Assistant

## 2023-01-31 VITALS — BP 121/63 | HR 85

## 2023-01-31 DIAGNOSIS — T839XXD Unspecified complication of genitourinary prosthetic device, implant and graft, subsequent encounter: Secondary | ICD-10-CM

## 2023-01-31 LAB — URINALYSIS, COMPLETE
Bilirubin, UA: NEGATIVE
Glucose, UA: NEGATIVE
Ketones, UA: NEGATIVE
Nitrite, UA: NEGATIVE
Specific Gravity, UA: >1.03 — ABNORMAL HIGH (ref 1.005–1.030)
Urobilinogen, Ur: 0.2 mg/dL (ref 0.2–1.0)
pH, UA: 6.5 (ref 5.0–7.5)

## 2023-01-31 LAB — MICROSCOPIC EXAMINATION: RBC, Urine: >30 /hpf — AB (ref 0–2)

## 2023-01-31 NOTE — Progress Notes (Signed)
Patient presented to clinic with concerns that his Foley catheter wasn't connected properly. I reseated it within his StatLock to prevent pulls; otherwise fixed appropriately to his leg. I gave him another StatLock to take home and we discussed how to replace it if/when needed.

## 2023-02-02 ENCOUNTER — Encounter: Payer: Self-pay | Admitting: Urology

## 2023-02-03 ENCOUNTER — Encounter: Payer: Self-pay | Admitting: Urology

## 2023-02-03 ENCOUNTER — Ambulatory Visit: Payer: Medicare Other | Admitting: Urology

## 2023-02-03 ENCOUNTER — Ambulatory Visit (INDEPENDENT_AMBULATORY_CARE_PROVIDER_SITE_OTHER): Payer: Medicare Other | Admitting: Urology

## 2023-02-03 VITALS — BP 152/81 | HR 84

## 2023-02-03 DIAGNOSIS — N390 Urinary tract infection, site not specified: Secondary | ICD-10-CM

## 2023-02-03 DIAGNOSIS — R339 Retention of urine, unspecified: Secondary | ICD-10-CM | POA: Diagnosis not present

## 2023-02-03 DIAGNOSIS — Z466 Encounter for fitting and adjustment of urinary device: Secondary | ICD-10-CM | POA: Diagnosis not present

## 2023-02-03 LAB — BLADDER SCAN AMB NON-IMAGING

## 2023-02-03 NOTE — Progress Notes (Signed)
02/03/2023 3:56 PM   Ernest Hutchinson 1951-09-24 [id number]  Referring provider: Danella Penton, MD [phone] [facility] [address] [facility] [place],  [place] [address]  Urological history: 1. BPH with LU TS -PSA (08/2022) 0.10 -s/p TURP (01/2022)  -cysto (06/2022) - open prostatic fossa w/o bladder neck contracture and an open bladder neck   2. rUTI's -contributing factors of age, BPH,   Chief Complaint  Patient presents with   Urinary Retention    HPI: Ernest Hutchinson is a 72 y.o. male who presents today for TOV with his wife, Ernest Hutchinson.    He is continue to take his Levaquin 250 mg as prescribed by his primary care physician.  His most recent urine culture is still preliminary status, but it states no growth.  He states the nystatin cream burned the tip of his penis as well.  He has been pushing fluids and urinating on his own.  His bladder scan noted a PVR 18 cc.  Patient denies any modifying or aggravating factors.  Patient denies any gross hematuria, dysuria or suprapubic/flank pain.  Patient denies any fevers, chills, nausea or vomiting.     PMH: Past Medical History:  Diagnosis Date   Arthritis    BPH (benign prostatic hyperplasia)    Complication of anesthesia 2021   after knee replacement, pt had delerium   COVID-19    Diverticulitis    GERD (gastroesophageal reflux disease)    H. pylori infection    History of kidney stones    Hyperlipemia    Hypertension    Hyperthyroidism    Sepsis 2022   Thyroid disease    Thyroid nodule    Vitamin B12 deficiency     Surgical History: Past Surgical History:  Procedure Laterality Date   CARPAL TUNNEL RELEASE Bilateral    COLONOSCOPY     x2   HERNIA REPAIR     HYDROCELE EXCISION     REPLACEMENT TOTAL KNEE Left    THYROIDECTOMY, PARTIAL     TONSILLECTOMY     TOTAL KNEE REVISION Left 05/01/2022   Procedure: TOTAL KNEE REVISION;  Surgeon: Donato Heinz, MD;  Location: [facility] ORS;  Service:  Orthopedics;  Laterality: Left;   TRANSURETHRAL RESECTION OF PROSTATE  01/30/2022   at [facility] Medications:  Allergies as of 02/03/2023       Reactions   Ceftin [cefuroxime] Anaphylaxis   Cefuroxime Axetil Anaphylaxis   Chlorhexidine Gluconate Itching        Medication List        Accurate as of February 03, 2023  3:56 PM. If you have any questions, ask your nurse or doctor.          atenolol 50 MG tablet Commonly known as: TENORMIN Take 50 mg by mouth daily. In morning   Cranberry 500 MG Caps Take 1 capsule by mouth 2 (two) times daily.   diazepam 5 MG tablet Commonly known as: VALIUM Take 5-7.5 mg by mouth at bedtime as needed.   finasteride 5 MG tablet Commonly known as: PROSCAR Take 5 mg by mouth daily. In morning   levofloxacin 250 MG tablet Commonly known as: LEVAQUIN Take 250 mg by mouth daily.   lisinopril 5 MG tablet Commonly known as: ZESTRIL Take 5 mg by mouth at bedtime.   nystatin-triamcinolone cream Commonly known as: MYCOLOG II Apply 1 Application topically 2 (two) times daily.   rosuvastatin 20 MG tablet Commonly known as: CRESTOR Take 20 mg  by mouth at bedtime.   SYSTANE OP Apply 1 drop to eye as needed.   tamsulosin 0.4 MG Caps capsule Commonly known as: FLOMAX Take 0.4 mg by mouth in the morning and at bedtime.        Allergies:  Allergies  Allergen Reactions   Ceftin [Cefuroxime] Anaphylaxis   Cefuroxime Axetil Anaphylaxis   Chlorhexidine Gluconate Itching    Family History: No family history on file.  Social History:  reports that he quit smoking about 39 years ago. His smoking use included cigarettes. He has a 30.00 pack-year smoking history. He has never used smokeless tobacco. He reports current drug use. He reports that he does not drink alcohol.  ROS: Pertinent ROS in HPI  Physical Exam: Constitutional:  Well nourished. Alert and oriented, No acute distress. HEENT: [place] AT, moist mucus membranes.  Trachea  midline Cardiovascular: No clubbing, cyanosis, or edema. Respiratory: Normal respiratory effort, no increased work of breathing. Neurologic: Grossly intact, no focal deficits, moving all 4 extremities. Psychiatric: Normal mood and affect.  Laboratory Data: Results for orders placed or performed in visit on 02/03/23  Bladder Scan (Post Void Residual) in office  Result Value Ref Range   Scan Result 39ml   Serum creatinine (01/2023) 1.23, eGFR 63  I have reviewed the labs.   Pertinent Imaging: N/A    Catheter Removal Patient is present today for a catheter removal.  9 ml of water was drained from the balloon. A 16 FR Coude foley cath was removed from the bladder, no complications were noted. Patient tolerated well.  Assessment & Plan:    1. UTI -Urine culture still pulmonary status, but it is no growth -Advised the patient to stop all antibiotics and the nystatin cream  2. Urinary retention -Foley catheter removed this a.m. to start voiding trial -Resolved -Will have patient return in 1 month for recheck on UA to ensure the microscopic hematuria has abated.  Return in about 1 month (around 03/05/2023) for IPSS and PVR.  These notes generated with voice recognition software. I apologize for typographical errors.  [name]  [facility] [address]  [address] [place], [place] [address] [phone]

## 2023-02-04 ENCOUNTER — Encounter: Payer: Self-pay | Admitting: Family Medicine

## 2023-02-04 LAB — CULTURE, URINE COMPREHENSIVE

## 2023-02-05 ENCOUNTER — Ambulatory Visit: Payer: Medicare Other | Admitting: Urology

## 2023-02-05 ENCOUNTER — Ambulatory Visit: Payer: Medicare Other | Admitting: Physician Assistant

## 2023-02-05 ENCOUNTER — Ambulatory Visit (INDEPENDENT_AMBULATORY_CARE_PROVIDER_SITE_OTHER): Payer: Medicare Other | Admitting: Urology

## 2023-02-05 DIAGNOSIS — R339 Retention of urine, unspecified: Secondary | ICD-10-CM | POA: Diagnosis not present

## 2023-02-05 LAB — BLADDER SCAN AMB NON-IMAGING: Scan Result: 194

## 2023-02-05 NOTE — Progress Notes (Signed)
02/05/23 16:11   Scan Result 194   He presented to the front desk this afternoon with the fear he may be in urinary retention.  He has voided three times today and his bladder scan demonstrated adequate emptying.   Patient is reassured and advised to keep follow-up.  We also reviewed return to clinic signs.

## 2023-02-28 ENCOUNTER — Ambulatory Visit (INDEPENDENT_AMBULATORY_CARE_PROVIDER_SITE_OTHER): Payer: Medicare Other | Admitting: Urology

## 2023-02-28 ENCOUNTER — Encounter: Payer: Self-pay | Admitting: Urology

## 2023-02-28 VITALS — BP 146/74 | HR 79 | Ht 67.0 in | Wt 165.0 lb

## 2023-02-28 DIAGNOSIS — N390 Urinary tract infection, site not specified: Secondary | ICD-10-CM

## 2023-02-28 DIAGNOSIS — R339 Retention of urine, unspecified: Secondary | ICD-10-CM

## 2023-02-28 DIAGNOSIS — N401 Enlarged prostate with lower urinary tract symptoms: Secondary | ICD-10-CM | POA: Diagnosis not present

## 2023-02-28 DIAGNOSIS — Z87440 Personal history of urinary (tract) infections: Secondary | ICD-10-CM

## 2023-02-28 LAB — BLADDER SCAN AMB NON-IMAGING

## 2023-02-28 LAB — URINALYSIS, COMPLETE
Bilirubin, UA: NEGATIVE
Glucose, UA: NEGATIVE
Ketones, UA: NEGATIVE
Leukocytes,UA: NEGATIVE
Nitrite, UA: NEGATIVE
Protein,UA: NEGATIVE
RBC, UA: NEGATIVE
Specific Gravity, UA: 1.01 (ref 1.005–1.030)
Urobilinogen, Ur: 0.2 mg/dL (ref 0.2–1.0)
pH, UA: 5 (ref 5.0–7.5)

## 2023-02-28 LAB — MICROSCOPIC EXAMINATION

## 2023-02-28 NOTE — Progress Notes (Signed)
I, Maysun L Gibbs,acting as a scribe for Riki Altes, MD.,have documented all relevant documentation on the behalf of Riki Altes, MD,as directed by  Riki Altes, MD while in the presence of Riki Altes, MD.  02/28/2023 1:03 PM   [name] 10/22/1950 [id number]  Referring provider: Danella Penton, MD [phone] [facility] [address] [facility] [place],  [place] [address]  Chief Complaint  Patient presents with   Follow-up   Urinary Retention   Urological history: 1. BPH with LUTS PSA (08/2022) 0.10 TURP (01/2022)  cysto (06/2022) - open prostatic fossa w/o bladder neck contracture and an open bladder neck    2. Recurrent UTI's contributing factors of age, BPH   HPI: Ernest Hutchinson is a 72 y.o. male here to discuss problems with bladder emptying.  I initially saw him in September of 2023.  He had a TURP at [facility] April 2023 and had been seen for an episode of urinary retention with a UTI.  Cystoscopy performed September 23 showed an open prostatic fossa.  He has recently been seen by our PA several times, April 2024 Had an episode of urinary retention with UTI at [facility] and had a Foley catheter placed.  His catheter was removed mid-April  Last visit 02/03/23 PVR was 18 mL  A repeat PVR 02/05/23 was 194 mL. States he has no complaints today and is voiding without problems. Denies dyschiric, gross hematuria No flank, abdominal, or pelvic pain.   PMH: Past Medical History:  Diagnosis Date   Arthritis    BPH (benign prostatic hyperplasia)    Complication of anesthesia 2021   after knee replacement, pt had delerium   COVID-19    Diverticulitis    GERD (gastroesophageal reflux disease)    H. pylori infection    History of kidney stones    Hyperlipemia    Hypertension    Hyperthyroidism    Sepsis (HCC) 2022   Thyroid disease    Thyroid nodule    Vitamin B12 deficiency     Surgical History: Past Surgical History:  Procedure  Laterality Date   CARPAL TUNNEL RELEASE Bilateral    COLONOSCOPY     x2   HERNIA REPAIR     HYDROCELE EXCISION     REPLACEMENT TOTAL KNEE Left    THYROIDECTOMY, PARTIAL     TONSILLECTOMY     TOTAL KNEE REVISION Left 05/01/2022   Procedure: TOTAL KNEE REVISION;  Surgeon: Donato Heinz, MD;  Location: [facility] ORS;  Service: Orthopedics;  Laterality: Left;   TRANSURETHRAL RESECTION OF PROSTATE  01/30/2022   at [facility] Medications:  Allergies as of 02/28/2023       Reactions   Ceftin [cefuroxime] Anaphylaxis   Cefuroxime Axetil Anaphylaxis   Chlorhexidine Gluconate Itching        Medication List        Accurate as of Feb 28, 2023  1:03 PM. If you have any questions, ask your nurse or doctor.          atenolol 50 MG tablet Commonly known as: TENORMIN Take 50 mg by mouth daily. In morning   Cranberry 500 MG Caps Take 1 capsule by mouth 2 (two) times daily.   diazepam 5 MG tablet Commonly known as: VALIUM Take 5-7.5 mg by mouth at bedtime as needed.   finasteride 5 MG tablet Commonly known as: PROSCAR Take 5 mg by mouth daily. In morning   lisinopril 5 MG tablet  Commonly known as: ZESTRIL Take 5 mg by mouth at bedtime.   nystatin-triamcinolone cream Commonly known as: MYCOLOG II Apply 1 Application topically 2 (two) times daily.   rosuvastatin 20 MG tablet Commonly known as: CRESTOR Take 20 mg by mouth at bedtime.   SYSTANE OP Apply 1 drop to eye as needed.   tamsulosin 0.4 MG Caps capsule Commonly known as: FLOMAX Take 0.4 mg by mouth in the morning and at bedtime.        Allergies:  Allergies  Allergen Reactions   Ceftin [Cefuroxime] Anaphylaxis   Cefuroxime Axetil Anaphylaxis   Chlorhexidine Gluconate Itching    Social History:  reports that he quit smoking about 39 years ago. His smoking use included cigarettes. He has a 30.00 pack-year smoking history. He has been exposed to tobacco smoke. He has never used smokeless tobacco. He  reports current drug use. He reports that he does not drink alcohol.   Physical Exam: BP (!) 146/74   Pulse 79   Ht 5\' 7"  (1.702 m)   Wt 165 lb (74.8 kg)   BMI 25.84 kg/m   Constitutional:  Alert and oriented, No acute distress. HEENT: [place] AT Respiratory: Normal respiratory effort, no increased work of breathing. Psychiatric: Normal mood and affect.   Assessment & Plan:    1. History urinary retention PVR today is 6 mL Continue Tamsulosin 1 year follow-up with PVR  2. History of recurrent UTI/sepsis Stressed that should he develop any signs/symptoms of UTI he should be seen either here or by PCP quickly UA today was clear.  I have reviewed the above documentation for accuracy and completeness, and I agree with the above.   Riki Altes, MD  [facility] [address] [place], [place] [address] [phone]

## 2023-03-10 ENCOUNTER — Ambulatory Visit: Payer: Medicare Other | Admitting: Urology

## 2023-04-04 ENCOUNTER — Emergency Department
Admission: EM | Admit: 2023-04-04 | Discharge: 2023-04-04 | Disposition: A | Discharge status: Home / Self Care | Payer: Medicare Other | Attending: Emergency Medicine | Admitting: Emergency Medicine

## 2023-04-04 ENCOUNTER — Other Ambulatory Visit: Payer: Self-pay

## 2023-04-04 DIAGNOSIS — R300 Dysuria: Secondary | ICD-10-CM | POA: Diagnosis not present

## 2023-04-04 DIAGNOSIS — I10 Essential (primary) hypertension: Secondary | ICD-10-CM | POA: Diagnosis not present

## 2023-04-04 DIAGNOSIS — R339 Retention of urine, unspecified: Secondary | ICD-10-CM | POA: Insufficient documentation

## 2023-04-04 LAB — URINALYSIS, ROUTINE W REFLEX MICROSCOPIC
Bilirubin Urine: NEGATIVE
Glucose, UA: NEGATIVE mg/dL
Hgb urine dipstick: NEGATIVE
Ketones, ur: NEGATIVE mg/dL
Nitrite: NEGATIVE
Protein, ur: NEGATIVE mg/dL
Specific Gravity, Urine: 1.009 (ref 1.005–1.030)
pH: 6 (ref 5.0–8.0)

## 2023-04-04 MED ORDER — LEVOFLOXACIN 750 MG PO TABS: 750.0000 mg | ORAL_TABLET | Freq: Every day | ORAL | 0 refills | Status: AC

## 2023-04-04 NOTE — ED Notes (Signed)
First Nurse Note: Pt arrives from [facility] with acute urinary rentention that started today.

## 2023-04-04 NOTE — ED Notes (Addendum)
Wife checking on pt as he has yet to come back to room. Pt reports pt states he is almost finished in restroom. Will update vitals once pt back to room.

## 2023-04-04 NOTE — ED Triage Notes (Signed)
Pt to ed from home via POV for possible urinary retention. Pt was sent over from [facility]. Pt is having increased sensation to urinate but unable to void a lot. Pt is caox4, in no acute distress and ambulatory in triage.

## 2023-04-04 NOTE — ED Notes (Signed)
Pt in NAD. Visitor at bedside.  

## 2023-04-04 NOTE — ED Provider Notes (Signed)
[facility] Emergency Department Provider Note     Event Date/Time   First MD Initiated Contact with Patient 04/04/23 2051     (approximate)   History   Urinary Retention   HPI  Ernest Hutchinson is a 72 y.o. male with a history of HLD, HTN, UTI, BPH, and nephrolithiasis presents for sudden onset of urinary retention.  He presents to the ED from [facility] for sensation of urge but with limited void spontaneously.  Patient notes he woke this morning with normal level health and reports normal spontaneous void.  As the day progressed, the patient noted his stream slowed sharply and he had sense of fullness and urgency in the pelvic region.  No other reports noted including fever, chills, sweats.  No chest pain abdominal pain and no history of indwelling recent catheter. History of TURP 4/23.  Bladder scan performed in the ED noted 500 mL in the bladder, the patient was subsequently able to void spontaneously.  He denies any ongoing complaints of pelvic pressure, urgency, or pain.   Physical Exam   Triage Vital Signs: ED Triage Vitals [04/04/23 1913]  Enc Vitals Group     BP (!) 147/81     Pulse Rate 74     Resp 16     Temp 98.8 F (37.1 C)     Temp Source Oral     SpO2 98 %     Weight 163 lb 2.3 oz (74 kg)     Height 5\' 7"  (1.702 m)     Head Circumference      Peak Flow      Pain Score 0     Pain Loc      Pain Edu?      Excl. in GC?     Most recent vital signs: Vitals:   04/04/23 2233 04/04/23 2234  BP:    Pulse: (!) 56   Resp:  18  Temp:    SpO2: 97%     General Awake, no distress. NAD CV:  Good peripheral perfusion.  RESP:  Normal effort.  ABD:  No distention.  Nontender.  No suprapubic tenderness.  No CVA tenderness elicited.   ED Results / Procedures / Treatments   Labs (all labs ordered are listed, but only abnormal results are displayed) Labs Reviewed  URINALYSIS, ROUTINE W REFLEX MICROSCOPIC - Abnormal; Notable for the following  components:      Result Value   Color, Urine YELLOW (*)    APPearance HAZY (*)    Leukocytes,Ua MODERATE (*)    Bacteria, UA MANY (*)    All other components within normal limits  URINE CULTURE    EKG   RADIOLOGY  No results found.   PROCEDURES:  Critical Care performed: No  Procedures   MEDICATIONS ORDERED IN ED: Medications - No data to display   IMPRESSION / MDM / ASSESSMENT AND PLAN / ED COURSE  I reviewed the triage vital signs and the nursing notes.                              Differential diagnosis includes, but is not limited to, urinary retention, UTI, prostatitis, BPH  Patient's presentation is most consistent with acute complicated illness / injury requiring diagnostic workup.  Patient's diagnosis is consistent with dysuria and urinary retention likely due to underlying BPH.  Unable to void spontaneously in the ED without incident.  Patient declined placement  of a Foley catheter at this time.  Urinalysis did reveal significant bacteriuria.  Urine culture is pending at this time.  Patient will be sent with a prescription for Levaquin empirically while culture results are pending.  Follow-up with his primary provider or urologist for ongoing evaluation management.  Patient is given ED precautions to return to the ED for any worsening or new symptoms.   FINAL CLINICAL IMPRESSION(S) / ED DIAGNOSES   Final diagnoses:  Dysuria  Urinary retention     Rx / DC Orders   ED Discharge Orders          Ordered    levofloxacin (LEVAQUIN) 750 MG tablet  Daily        04/04/23 2210             Note:  This document was prepared using Dragon voice recognition software and may include unintentional dictation errors.    Lissa Hoard, PA-C 04/05/23 0008    Dionne Bucy, MD 04/14/23 0700

## 2023-04-04 NOTE — ED Notes (Signed)
Provider JM at bedside.  

## 2023-04-04 NOTE — Discharge Instructions (Signed)
You have a urine culture and at this time.  Take antibiotic as discussed.  Follow-up with primary provider or urology for ongoing care and evaluation of culture results next week.  Return to the ED for acute or worsening urinary retention.

## 2023-04-04 NOTE — ED Notes (Signed)
Pt denies feeling of fullness or pressure in bladder currently.

## 2023-04-04 NOTE — ED Notes (Signed)
Pt up to restroom; hx of several UTI's; procedures/tests at [facility] in April or May due to urinary retention/UTI's per wife; states realized today was unable to urinate like normal. Pt's skin dry, resp reg/unlabored and sitting calmly in chair by choice. Pt up to restroom now. Pt given urinal to measure urine sample and sample cup to send to lab.

## 2023-04-07 ENCOUNTER — Other Ambulatory Visit: Payer: Self-pay | Admitting: Orthopedic Surgery

## 2023-04-07 DIAGNOSIS — M5416 Radiculopathy, lumbar region: Secondary | ICD-10-CM

## 2023-04-07 LAB — URINE CULTURE

## 2023-04-08 LAB — URINE CULTURE
Culture: >=100000 — AB
Special Requests: NORMAL

## 2023-04-09 NOTE — Progress Notes (Signed)
ED Antimicrobial Stewardship Positive Culture Follow Up   Ernest Hutchinson is an 72 y.o. male who presented to [facility] on 04/04/2023 with a chief complaint of pelvic pain. There was c/f prostatitis.  Chief Complaint  Patient presents with   Urinary Retention    Recent Results (from the past 720 hour(s))  Urine Culture     Status: Abnormal   Collection Time: 04/04/23  9:50 PM   Specimen: Urine, Random  Result Value Ref Range Status   Specimen Description   Final    URINE, RANDOM Performed at [facility], [address], [place], [place] [address]    Special Requests   Final    Normal Performed at [facility], [address] [place], [place] [address]    Culture (A)  Final    >=100,000 COLONIES/mL KLEBSIELLA PNEUMONIAE Confirmed Extended Spectrum Beta-Lactamase Producer (ESBL).  In bloodstream infections from ESBL organisms, carbapenems are preferred over piperacillin/tazobactam. They are shown to have a lower risk of mortality.    Report Status 04/08/2023 FINAL  Final   Organism ID, Bacteria KLEBSIELLA PNEUMONIAE (A)  Final      Susceptibility   Klebsiella pneumoniae - MIC*    AMPICILLIN >=32 RESISTANT Resistant     CEFAZOLIN >=64 RESISTANT Resistant     CEFEPIME >=32 RESISTANT Resistant     CEFTRIAXONE >=64 RESISTANT Resistant     CIPROFLOXACIN 2 RESISTANT Resistant     GENTAMICIN >=16 RESISTANT Resistant     IMIPENEM <=0.25 SENSITIVE Sensitive     NITROFURANTOIN 128 RESISTANT Resistant     TRIMETH/SULFA >=320 RESISTANT Resistant     AMPICILLIN/SULBACTAM >=32 RESISTANT Resistant     PIP/TAZO 32 INTERMEDIATE Intermediate     * >=100,000 COLONIES/mL KLEBSIELLA PNEUMONIAE    [x]  Treated with levofloxacin, organism resistant to prescribed antimicrobial []  Patient discharged originally without antimicrobial agent and treatment is now indicated  Attempted to call patient and ask them to return to ED (only sensitive antibiotic is imipenem). No answer.  Left voicemail.  ED Provider: Dr. Mikael Spray, PharmD, BCPS Clinical Pharmacist  04/09/2023 3:02 PM

## 2023-04-11 ENCOUNTER — Ambulatory Visit
Admission: RE | Admit: 2023-04-11 | Discharge: 2023-04-11 | Disposition: A | Discharge status: Home / Self Care | Payer: Medicare Other | Source: Ambulatory Visit | Attending: Orthopedic Surgery | Admitting: Orthopedic Surgery

## 2023-04-11 DIAGNOSIS — M5416 Radiculopathy, lumbar region: Secondary | ICD-10-CM | POA: Diagnosis not present

## 2023-06-24 ENCOUNTER — Encounter: Payer: Self-pay | Admitting: Urology

## 2023-06-24 ENCOUNTER — Ambulatory Visit (INDEPENDENT_AMBULATORY_CARE_PROVIDER_SITE_OTHER): Payer: Medicare Other | Admitting: Urology

## 2023-06-24 VITALS — BP 117/66 | HR 67

## 2023-06-24 DIAGNOSIS — T839XXD Unspecified complication of genitourinary prosthetic device, implant and graft, subsequent encounter: Secondary | ICD-10-CM

## 2023-06-24 DIAGNOSIS — R339 Retention of urine, unspecified: Secondary | ICD-10-CM | POA: Diagnosis not present

## 2023-06-24 NOTE — Progress Notes (Signed)
06/24/2023 1:40 PM   Ernest Hutchinson 1951-01-19 [id number]  Referring provider: Danella Penton, MD [phone] [facility] [address] [facility] [place],  [place] [address]  Urological history: 1. BPH with LU TS -PSA (08/2022) 0.10 -s/p TURP (01/2022)  -cysto (06/2022) - open prostatic fossa w/o bladder neck contracture and an open bladder neck   2. rUTI's -contributing factors of age, BPH,   Chief Complaint  Patient presents with   Penis Pain    HPI: Ernest Hutchinson is a 72 y.o. male who presents today for tip of his penis burning.   Previous records reviewed.   He was seen in [facility] ER on June 21, 2019 for for urinary retention.  A Foley catheter was placed for return of 1 L of urine.  He walked in the front desk today stating the tip of his penis with burning.  He wasn't completely such if he had missed some tamsulosin or finasteride and that is what caused his urinary retention.  Foley catheter is in place draining clear yellow urine. PMH: Past Medical History:  Diagnosis Date   Arthritis    BPH (benign prostatic hyperplasia)    Complication of anesthesia 2021   after knee replacement, pt had delerium   COVID-19    Diverticulitis    GERD (gastroesophageal reflux disease)    H. pylori infection    History of kidney stones    Hyperlipemia    Hypertension    Hyperthyroidism    Sepsis (HCC) 2022   Thyroid disease    Thyroid nodule    Vitamin B12 deficiency     Surgical History: Past Surgical History:  Procedure Laterality Date   CARPAL TUNNEL RELEASE Bilateral    COLONOSCOPY     x2   HERNIA REPAIR     HYDROCELE EXCISION     REPLACEMENT TOTAL KNEE Left    THYROIDECTOMY, PARTIAL     TONSILLECTOMY     TOTAL KNEE REVISION Left 05/01/2022   Procedure: TOTAL KNEE REVISION;  Surgeon: Donato Heinz, MD;  Location: [facility] ORS;  Service: Orthopedics;  Laterality: Left;   TRANSURETHRAL RESECTION OF PROSTATE  01/30/2022   at [facility]  Medications:  Allergies as of 06/24/2023       Reactions   Ceftin [cefuroxime] Anaphylaxis   Cefuroxime Axetil Anaphylaxis   Chlorhexidine Gluconate Itching        Medication List        Accurate as of June 24, 2023  1:40 PM. If you have any questions, ask your nurse or doctor.          atenolol 50 MG tablet Commonly known as: TENORMIN Take 50 mg by mouth daily. In morning   Cranberry 500 MG Caps Take 1 capsule by mouth 2 (two) times daily.   diazepam 5 MG tablet Commonly known as: VALIUM Take 5-7.5 mg by mouth at bedtime as needed.   finasteride 5 MG tablet Commonly known as: PROSCAR Take 5 mg by mouth daily. In morning   lisinopril 5 MG tablet Commonly known as: ZESTRIL Take 5 mg by mouth at bedtime.   nystatin-triamcinolone cream Commonly known as: MYCOLOG II Apply 1 Application topically 2 (two) times daily.   rosuvastatin 20 MG tablet Commonly known as: CRESTOR Take 20 mg by mouth at bedtime.   SYSTANE OP Apply 1 drop to eye as needed.   tamsulosin 0.4 MG Caps capsule Commonly known as: FLOMAX Take 0.4 mg by mouth in the  morning and at bedtime.        Allergies:  Allergies  Allergen Reactions   Ceftin [Cefuroxime] Anaphylaxis   Cefuroxime Axetil Anaphylaxis   Chlorhexidine Gluconate Itching    Family History: No family history on file.  Social History:  reports that he quit smoking about 39 years ago. His smoking use included cigarettes. He started smoking about 59 years ago. He has a 30 pack-year smoking history. He has been exposed to tobacco smoke. He has never used smokeless tobacco. He reports current drug use. He reports that he does not drink alcohol.  ROS: Pertinent ROS in HPI  Physical Exam: Blood pressure 117/66, pulse 67.  Constitutional:  Well nourished. Alert and oriented, No acute distress. GU: No CVA tenderness.  No bladder fullness or masses.  Patient with uncircumcised phallus. Foreskin easily retracted  Urethral  meatus is patent.  No penile discharge. No penile lesions or rashes. Scrotum without lesions, cysts, rashes and/or edema.    Laboratory Data: Urinalysis Chemical with Reflex to Microscopic Review Order: [id number] Component Ref Range & Units 3 d ago  Color Colorless, Straw, Light Yellow, Yellow, Dark Yellow Colorless  Clarity Clear Clear  Specific Gravity 1.005 - 1.030 1.004 Low   pH, Urine 5.0 - 8.0 5.5  Protein, Urinalysis Negative Negative  Glucose, Urinalysis Negative Negative  Ketones, Urinalysis Negative Negative  Blood, Urinalysis Negative Negative  Nitrite, Urinalysis Negative Negative  Leukocytes, Urinalysis Negative Negative  Bilirubin, Urinalysis Negative Negative  Urobilinogen, Urinalysis 0.2 - 1.0 mg/dL 0.2  Resulting Agency [facility]  Narrative Performed by [facility] Reporting of microscopic urinalysis (UA) bacteria and yeast results was discontinued on August 30th, 2021 due to poor clinical predictive value for urinary tract infection (UTI). Providers should use other clinical signs and symptoms to diagnose UTI including urine leukocyte esterase, white blood cell count, and culture in accordance with local and national guidelines. Additional information and rationale can obtained by emailing [email or web address].  Specimen Collected: 06/21/23 13:07   Performed by: [facility] Last Resulted: 06/21/23 13:28  Received From: [facility]  Result Received: 06/24/23 09:26   Complete Blood Count (CBC) with Differential Order: [id number] Component Ref Range & Units 3 d ago  WBC (White Blood Cell Count) 3.2 - 9.8 x10^9/L 5.7  Hemoglobin 13.7 - 17.3 g/dL 08.6  Hematocrit 57.8 - 49.0 % 47.1  Platelets 150 - 450 x10^9/L 203  MCV (Mean Corpuscular Volume) 80 - 98 fL 91  MCH (Mean Corpuscular Hemoglobin) 26.5 - 34.0 pg 30.8  MCHC (Mean Corpuscular Hemoglobin  Concentration) 31.5 - 36.3 % 34.0  RBC (Red Blood Cell Count) 4.37 - 5.74 x10^12/L 5.20  RDW-CV (Red Cell Distribution Width) 11.5 - 14.5 % 13.3  NRBC (Nucleated Red Blood Cell Count) 0 x10^9/L 0.00  NRBC % (Nucleated Red Blood Cell %) % 0.0  MPV (Mean Platelet Volume) 7.2 - 11.7 fL 10.1  Neutrophil Count 2.0 - 8.6 x10^9/L 3.5  Neutrophil % 37 - 80 % 61.2  Lymphocyte Count 0.6 - 4.2 x10^9/L 1.4  Lymphocyte % 10 - 50 % 25.0  Monocyte Count 0 - 0.9 x10^9/L 0.6  Monocyte % 0 - 12 % 10.5  Eosinophil Count 0 - 0.70 x10^9/L 0.11  Eosinophil % 0 - 7 % 1.9  Basophil Count 0 - 0.20 x10^9/L 0.05  Basophil % 0 - 2 % 0.9  Immature Granulocyte Count <=0.06 x10^9/L 0.03  Immature Granulocyte % <=0.7 % 0.5  Resulting Agency [facility]   Specimen Collected: 06/21/23 11:48   Performed by: [facility] Last Resulted: 06/21/23 12:23  Received From: [facility]  Result Received: 06/24/23 09:26   Comprehensive Metabolic Panel (CMP) Order: [id number] Component Ref Range & Units 3 d ago  Sodium 135 - 145 mmol/L 140  Potassium 3.5 - 5.0 mmol/L 4.3  Comment: SPECIMEN HEMOLYZED, INTERFERENCE CAUSED BY THIS DEGREE OF HEMOLYSIS MAY AFFECT THE FINAL RESULT. INTERPRET RESULT WITH CAUTION.  Chloride 98 - 108 mmol/L 106  Carbon Dioxide (CO2) 21 - 30 mmol/L 23  Urea Nitrogen (BUN) 7 - 20 mg/dL 13  Creatinine 0.6 - 1.3 mg/dL 1.2  Glucose 70 - 440 mg/dL 102  Comment: Interpretive Data: Above is the NONFASTING reference range.  Below are the FASTING reference ranges: NORMAL:      70-99 mg/dL PREDIABETES: 725-366 mg/dL DIABETES:    > 440 mg/dL  Calcium 8.7 - 34.7 mg/dL 9.6  AST (Aspartate Aminotransferase) 15 - 41 U/L 39  Comment: SPECIMEN HEMOLYZED, INTERFERENCE CAUSED BY THIS DEGREE OF HEMOLYSIS MAY AFFECT THE FINAL RESULT. INTERPRET RESULT WITH CAUTION.  ALT (Alanine Aminotransferase) 15 - 50 U/L 35  Comment: SPECIMEN  HEMOLYZED, INTERFERENCE CAUSED BY THIS DEGREE OF HEMOLYSIS MAY AFFECT THE FINAL RESULT. INTERPRET RESULT WITH CAUTION.  Bilirubin, Total 0.4 - 1.5 mg/dL 1.3  Comment: SPECIMEN HEMOLYZED, INTERFERENCE CAUSED BY THIS DEGREE OF HEMOLYSIS MAY AFFECT THE FINAL RESULT. INTERPRET RESULT WITH CAUTION.  Alk Phos (Alkaline Phosphatase) 24 - 110 U/L 61  Albumin 3.5 - 4.8 g/dL 4.0  Protein, Total 6.2 - 8.1 g/dL 6.5  Anion Gap 3 - 12 mmol/L 11  BUN/CREA Ratio 6 - 27 11  Glomerular Filtration Rate (eGFR) mL/min/1.73sq m 65  Comment: CKD-EPI (2021) does not include patient's race in the calculation of eGFR. Monitoring changes of plasma creatinine and eGFR over time is useful for monitoring kidney function.  This change was made on 12/19/2020.  Interpretive Ranges for eGFR(CKD-EPI 2021):  eGFR:              > 60 mL/min/1.73 sq m - Normal eGFR:              30 - 59 mL/min/1.73 sq m - Moderately Decreased eGFR:              15 - 29 mL/min/1.73 sq m - Severely Decreased eGFR:              < 15 mL/min/1.73 sq m -  Kidney Failure   Note: These eGFR calculations do not apply in acute situations when eGFR is changing rapidly or in patients on dialysis.  Resulting Agency [facility]   Specimen Collected: 06/21/23 11:48   Performed by: [facility] Last Resulted: 06/21/23 12:45  Received From: [facility]  Result Received: 06/24/23 09:26  I have reviewed the labs.   Pertinent Imaging: N/A  Assessment & Plan:    1. Urinary retention -explained that the burning at the tip of his penis was irritation with the catheter and it is normal -return next week for TOV -I wrote down the medications tamsulosin and finasteride, so that he could go home and show his wife to make sure he is on these medications as prescribed -If he fails another voiding trial, we may need to consider urodynamic studies  Return in about 1 week (around 07/01/2023) for TOV  and PVR .  These notes generated with voice recognition software. I apologize  for typographical errors.  [name]  [facility] [address]  [address] [place], [place] [address] [phone]

## 2023-06-27 ENCOUNTER — Other Ambulatory Visit: Payer: Self-pay

## 2023-06-27 ENCOUNTER — Telehealth: Payer: Self-pay

## 2023-06-27 ENCOUNTER — Emergency Department
Admission: EM | Admit: 2023-06-27 | Discharge: 2023-06-27 | Disposition: A | Discharge status: Home / Self Care | Payer: Medicare Other | Attending: Emergency Medicine | Admitting: Emergency Medicine

## 2023-06-27 DIAGNOSIS — R339 Retention of urine, unspecified: Secondary | ICD-10-CM | POA: Diagnosis not present

## 2023-06-27 DIAGNOSIS — E039 Hypothyroidism, unspecified: Secondary | ICD-10-CM | POA: Diagnosis not present

## 2023-06-27 DIAGNOSIS — T83098A Other mechanical complication of other indwelling urethral catheter, initial encounter: Secondary | ICD-10-CM | POA: Insufficient documentation

## 2023-06-27 DIAGNOSIS — T83091A Other mechanical complication of indwelling urethral catheter, initial encounter: Secondary | ICD-10-CM

## 2023-06-27 DIAGNOSIS — Y69 Unspecified misadventure during surgical and medical care: Secondary | ICD-10-CM | POA: Insufficient documentation

## 2023-06-27 LAB — URINALYSIS, ROUTINE W REFLEX MICROSCOPIC
Bacteria, UA: NONE SEEN
Bilirubin Urine: NEGATIVE
Glucose, UA: NEGATIVE mg/dL
Ketones, ur: NEGATIVE mg/dL
Leukocytes,Ua: NEGATIVE
Nitrite: NEGATIVE
Protein, ur: NEGATIVE mg/dL
Specific Gravity, Urine: 1.006 (ref 1.005–1.030)
Squamous Epithelial / HPF: NONE SEEN /HPF (ref 0–5)
pH: 6 (ref 5.0–8.0)

## 2023-06-27 NOTE — ED Notes (Signed)
This nurse had gone in to flush the pt's foley, per order, but the catheter was draining clear urine. Ernest Hutchinson was informed. Catheter emptied for approximately 300ccs of urine

## 2023-06-27 NOTE — Discharge Instructions (Signed)
Follow-up with [place] [facility] as scheduled or as discussed.

## 2023-06-27 NOTE — ED Triage Notes (Signed)
Pt presents to the ED POV from home. Pt has a catheter in for a UTI. Pt states that it stopped draining today and he feels like it is obstructed.

## 2023-06-27 NOTE — Telephone Encounter (Signed)
Received fax from access nurse from yesterday evening 06/26/23, stating patient had catheter put in on 06/24/23 and it is becoming more painful and not draining as well and wanted to get in to be seen.  I called patient this morning at 10 am and left detailed message asking him to call our office to update [place] on how he was doing today.

## 2023-06-27 NOTE — Telephone Encounter (Signed)
Incoming call from patient on the triage line who states his catheter feels taunt and would like a tube extender. Advised pt to come by the office today to pick up. Pt voiced understanding.

## 2023-06-27 NOTE — Telephone Encounter (Signed)
Patient came in the office for a extender tube after lunch . We was not able to put a extender on. He wanted it on this cathter and urethra. The extender tube is not for that. I place a sticker closer on this leg. The cathter was draining into the bag. Patient will return to our office on 06/30/2023 for a voiding trail.

## 2023-06-27 NOTE — ED Provider Notes (Signed)
[facility] Emergency Department Provider Note     Event Date/Time   First MD Initiated Contact with Patient 06/27/23 1731     (approximate)   History   Urinary Retention   HPI  Ernest Hutchinson is a 72 y.o. male with a history of HLD, BPH, recurrent UTIs, GERD, hypothyroidism, presents to the ED with an indwelling Foley catheter.  Foley catheter placed on August 31 at [facility] emergency department.  He has been seen by his primary provider as well as his urology office as a walk-in today noting bladder fullness and decreased urinary bag output.  Patient is currently on a course of Cipro provided by his PCP for his recurrent UTIs.  Patient notes that the sticker securing the catheter to his leg was adjusted at the urology office today.  Patient presents to the ED noting that since this afternoon, he has not had any spontaneous passage of urine into the leg bag.   Physical Exam   Triage Vital Signs: ED Triage Vitals  Encounter Vitals Group     BP 06/27/23 1613 130/69     Systolic BP Percentile --      Diastolic BP Percentile --      Pulse Rate 06/27/23 1613 69     Resp 06/27/23 1613 18     Temp 06/27/23 1613 98.1 F (36.7 C)     Temp Source 06/27/23 1613 Oral     SpO2 06/27/23 1613 96 %     Weight 06/27/23 1615 164 lb (74.4 kg)     Height 06/27/23 1615 5' 6.5" (1.689 m)     Head Circumference --      Peak Flow --      Pain Score 06/27/23 1615 8     Pain Loc --      Pain Education --      Exclude from Growth Chart --     Most recent vital signs: Vitals:   06/27/23 1613 06/27/23 1800  BP: 130/69 115/67  Pulse: 69 63  Resp: 18   Temp: 98.1 F (36.7 C)   SpO2: 96% 99%    General Awake, no distress. NAD CV:  Good peripheral perfusion.  RESP:  Normal effort.  ABD:  No distention.  GU:  Catheter in place with clear yellow urine noted in the leg bag.   ED Results / Procedures / Treatments   Labs (all labs ordered are listed, but  only abnormal results are displayed) Labs Reviewed  URINALYSIS, ROUTINE W REFLEX MICROSCOPIC - Abnormal; Notable for the following components:      Result Value   Color, Urine STRAW (*)    APPearance CLEAR (*)    Hgb urine dipstick MODERATE (*)    All other components within normal limits   EKG  RADIOLOGY   No results found.   PROCEDURES:  Critical Care performed: No  Procedures   MEDICATIONS ORDERED IN ED: Medications - No data to display   IMPRESSION / MDM / ASSESSMENT AND PLAN / ED COURSE  I reviewed the triage vital signs and the nursing notes.                              Differential diagnosis includes, but is not limited to, Foley dysfunction, dislodged Foley, deflated balloon  Patient's presentation is most consistent with acute, uncomplicated illness.  Patient's diagnosis is consistent with Foley catheter dysfunction.  The nurse reported that  upon her evaluation in the room, the patient's Foley was spontaneously draining into the bag without difficulty.  Patient had a urinal at bedside with approximately 300 cc of clear urine noted.  An additional 50 cc of clear urine was noted in the leg bag upon my evaluation.  Bladder scan reportedly was reading at 0.  Patient reports improvement of his symptoms without any ongoing pelvic fullness or pressure. Patient is to follow up with urology as scheduled, as needed or otherwise directed. Patient is given ED precautions to return to the ED for any worsening or new symptoms.  FINAL CLINICAL IMPRESSION(S) / ED DIAGNOSES   Final diagnoses:  Obstruction of Foley catheter, initial encounter [facility])     Rx / DC Orders   ED Discharge Orders     None        Note:  This document was prepared using Dragon voice recognition software and may include unintentional dictation errors.    Lissa Hoard, PA-C 06/27/23 1859    Merwyn Katos, MD 06/27/23 1946

## 2023-06-30 ENCOUNTER — Ambulatory Visit: Payer: Medicare Other | Admitting: Physician Assistant

## 2023-06-30 ENCOUNTER — Ambulatory Visit (INDEPENDENT_AMBULATORY_CARE_PROVIDER_SITE_OTHER): Payer: Medicare Other | Admitting: Physician Assistant

## 2023-06-30 VITALS — BP 141/75 | HR 83 | Ht 66.5 in | Wt 164.0 lb

## 2023-06-30 DIAGNOSIS — R339 Retention of urine, unspecified: Secondary | ICD-10-CM

## 2023-06-30 LAB — BLADDER SCAN AMB NON-IMAGING: Scan Result: 75

## 2023-06-30 NOTE — Progress Notes (Signed)
06/30/2023 3:47 PM   Ernest Hutchinson [birth year] [id number]  CC: Chief Complaint  Patient presents with   voiding trial   HPI: Ernest Hutchinson is a 72 y.o. male with PMH BPH with LUTS s/p TURP at [facility] in April 2023 with cystoscopy in September 2023 showing an open prostatic fossa without bladder neck contracture who has had intermittent episodes of urinary retention postoperatively and recurrent UTI who presents today for voiding trial.   He was seen at [facility] ED on 06/21/2023 with acute urinary retention requiring Foley catheter placement.  Bladder immediately drained 1 L of urine following Foley placement.  Foley catheter removed in clinic in the morning, see separate procedure note for details.  He returned to clinic in the afternoon.  He has voided multiple times today without difficulty.  PVR 75 mL.  He denies taking OTC medications at the time of urinary retention.  He does not understand why this keeps happening.  He is interested in additional testing to figure out why.  PMH: Past Medical History:  Diagnosis Date   Arthritis    BPH (benign prostatic hyperplasia)    Complication of anesthesia 2021   after knee replacement, pt had delerium   COVID-19    Diverticulitis    GERD (gastroesophageal reflux disease)    H. pylori infection    History of kidney stones    Hyperlipemia    Hypertension    Hyperthyroidism    Sepsis (HCC) 2022   Thyroid disease    Thyroid nodule    Vitamin B12 deficiency     Surgical History: Past Surgical History:  Procedure Laterality Date   CARPAL TUNNEL RELEASE Bilateral    COLONOSCOPY     x2   HERNIA REPAIR     HYDROCELE EXCISION     REPLACEMENT TOTAL KNEE Left    THYROIDECTOMY, PARTIAL     TONSILLECTOMY     TOTAL KNEE REVISION Left 05/01/2022   Procedure: TOTAL KNEE REVISION;  Surgeon: Donato Heinz, MD;  Location: [facility] ORS;  Service: Orthopedics;  Laterality: Left;   TRANSURETHRAL RESECTION OF PROSTATE  01/30/2022   at [facility] Medications:  Allergies as of 06/30/2023       Reactions   Ceftin [cefuroxime] Anaphylaxis   Cefuroxime Axetil Anaphylaxis   Chlorhexidine Gluconate Itching        Medication List        Accurate as of June 30, 2023  3:47 PM. If you have any questions, ask your nurse or doctor.          atenolol 50 MG tablet Commonly known as: TENORMIN Take 50 mg by mouth daily. In morning   Cranberry 500 MG Caps Take 1 capsule by mouth 2 (two) times daily.   diazepam 5 MG tablet Commonly known as: VALIUM Take 5-7.5 mg by mouth at bedtime as needed.   finasteride 5 MG tablet Commonly known as: PROSCAR Take 5 mg by mouth daily. In morning   lisinopril 5 MG tablet Commonly known as: ZESTRIL Take 5 mg by mouth at bedtime.   nystatin-triamcinolone cream Commonly known as: MYCOLOG II Apply 1 Application topically 2 (two) times daily.   rosuvastatin 20 MG tablet Commonly known as: CRESTOR Take 20 mg by mouth at bedtime.   SYSTANE OP Apply 1 drop to eye as needed.   tamsulosin 0.4 MG Caps capsule Commonly known as: FLOMAX Take 0.4 mg by mouth in the morning and at bedtime.  Allergies:  Allergies  Allergen Reactions   Ceftin [Cefuroxime] Anaphylaxis   Cefuroxime Axetil Anaphylaxis   Chlorhexidine Gluconate Itching    Family History: No family history on file.  Social History:   reports that he quit smoking about 39 years ago. His smoking use included cigarettes. He started smoking about 59 years ago. He has a 30 pack-year smoking history. He has been exposed to tobacco smoke. He has never used smokeless tobacco. He reports current drug use. He reports that he does not drink alcohol.  Physical Exam: BP (!) 141/75   Pulse 83   Ht 5' 6.5" (1.689 m)   Wt 164 lb (74.4 kg)   BMI 26.07 kg/m   Constitutional:  Alert and oriented, no acute distress, nontoxic appearing HEENT: [place], AT Cardiovascular: No clubbing, cyanosis, or edema Respiratory: Normal  respiratory effort, no increased work of breathing Skin: No rashes, bruises or suspicious lesions Neurologic: Grossly intact, no focal deficits, moving all 4 extremities Psychiatric: Normal mood and affect  Laboratory Data: Results for orders placed or performed in visit on 06/30/23  Bladder Scan (Post Void Residual) in office  Result Value Ref Range   Scan Result 75 ml    Assessment & Plan:   1. Urinary retention Voiding trial passed.  I agree that his presentation with intermittent urinary retention is rather odd and I cannot identify any clear contributing factors.  We discussed pursuing urodynamics testing at [facility], however he would prefer to do this at [facility].  I gave him information about urodynamics in his AVS today so he can follow-up with them appropriately.  I also offered him C[facility] teaching so that he can relieve his bladder if this happens again in the future and save him a trip to the ED, but he declined. - Bladder Scan (Post Void Residual) in office   Return if symptoms worsen or fail to improve.  Carman Ching, PA-C  [facility] [place] [address], [address] [place], [place] [address] [phone]

## 2023-06-30 NOTE — Progress Notes (Signed)
Catheter Removal  Patient is present today for a catheter removal.  8 ml of water was drained from the balloon. A 16 FR foley cath was removed from the bladder, no complications were noted. Patient tolerated well.  Performed by: Danie Diehl H RMA  Follow up/ Additional notes: return in the pm for PVR

## 2023-06-30 NOTE — Group Note (Deleted)

## 2023-06-30 NOTE — Patient Instructions (Signed)
If you would like to try to find out why you go into urinary retention on occasion, I'd recommend pursuing a test called urodynamics (more information on this is at the back of this packet). We do this at our [facility] office, however you may also consider reaching out to [facility] to have it done there. Please let me know if you'd like to consider this.

## 2023-07-01 ENCOUNTER — Ambulatory Visit: Payer: Medicare Other | Admitting: Physician Assistant

## 2023-07-03 ENCOUNTER — Encounter: Payer: Self-pay | Admitting: Physician Assistant

## 2023-07-03 ENCOUNTER — Ambulatory Visit (INDEPENDENT_AMBULATORY_CARE_PROVIDER_SITE_OTHER): Payer: Medicare Other | Admitting: Physician Assistant

## 2023-07-03 VITALS — BP 113/69

## 2023-07-03 DIAGNOSIS — R339 Retention of urine, unspecified: Secondary | ICD-10-CM | POA: Diagnosis not present

## 2023-07-03 LAB — BLADDER SCAN AMB NON-IMAGING: Scan Result: 130

## 2023-07-03 NOTE — Progress Notes (Signed)
07/03/2023 12:49 PM   Ernest Hutchinson 19-Feb-1951 [id number]  CC: Chief Complaint  Patient presents with   Follow-up   HPI: Ernest Hutchinson is a 72 y.o. male with PMH BPH with LUTS s/p TURP at [facility] in April 2023 with cystoscopy in September 2023 showing an open prostatic fossa without bladder neck contracture who has had intermittent episodes of urinary retention postoperatively and recurrent UTI who presents today for evaluation of possible recurrent urinary retention after passing a voiding trial with me earlier this week.   He walked into clinic today with reports of weak stream and straining and concern for urinary retention.  He voided 30 minutes prior to arrival and declined to void again.  Bladder scan with 130 mL.  He denies dysuria.  When I saw him earlier this week, he declined CIC teaching.  I recommended urodynamics, but he preferred to do this at [facility]  Today he asks if I referred him to [facility] yet.  PMH: Past Medical History:  Diagnosis Date   Arthritis    BPH (benign prostatic hyperplasia)    Complication of anesthesia 2021   after knee replacement, pt had delerium   COVID-19    Diverticulitis    GERD (gastroesophageal reflux disease)    H. pylori infection    History of kidney stones    Hyperlipemia    Hypertension    Hyperthyroidism    Sepsis (HCC) 2022   Thyroid disease    Thyroid nodule    Vitamin B12 deficiency     Surgical History: Past Surgical History:  Procedure Laterality Date   CARPAL TUNNEL RELEASE Bilateral    COLONOSCOPY     x2   HERNIA REPAIR     HYDROCELE EXCISION     REPLACEMENT TOTAL KNEE Left    THYROIDECTOMY, PARTIAL     TONSILLECTOMY     TOTAL KNEE REVISION Left 05/01/2022   Procedure: TOTAL KNEE REVISION;  Surgeon: Donato Heinz, MD;  Location: [facility] ORS;  Service: Orthopedics;  Laterality: Left;   TRANSURETHRAL RESECTION OF PROSTATE  01/30/2022   at [facility] Medications:  Allergies as of 07/03/2023       Reactions    Ceftin [cefuroxime] Anaphylaxis   Cefuroxime Axetil Anaphylaxis   Chlorhexidine Gluconate Itching        Medication List        Accurate as of July 03, 2023 12:49 PM. If you have any questions, ask your nurse or doctor.          atenolol 50 MG tablet Commonly known as: TENORMIN Take 50 mg by mouth daily. In morning   Cranberry 500 MG Caps Take 1 capsule by mouth 2 (two) times daily.   diazepam 5 MG tablet Commonly known as: VALIUM Take 5-7.5 mg by mouth at bedtime as needed.   finasteride 5 MG tablet Commonly known as: PROSCAR Take 5 mg by mouth daily. In morning   lisinopril 5 MG tablet Commonly known as: ZESTRIL Take 5 mg by mouth at bedtime.   nystatin-triamcinolone cream Commonly known as: MYCOLOG II Apply 1 Application topically 2 (two) times daily.   rosuvastatin 20 MG tablet Commonly known as: CRESTOR Take 20 mg by mouth at bedtime.   SYSTANE OP Apply 1 drop to eye as needed.   tamsulosin 0.4 MG Caps capsule Commonly known as: FLOMAX Take 0.4 mg by mouth in the morning and at bedtime.        Allergies:  Allergies  Allergen  Reactions   Ceftin [Cefuroxime] Anaphylaxis   Cefuroxime Axetil Anaphylaxis   Chlorhexidine Gluconate Itching    Family History: No family history on file.  Social History:   reports that he quit smoking about 39 years ago. His smoking use included cigarettes. He started smoking about 59 years ago. He has a 30 pack-year smoking history. He has been exposed to tobacco smoke. He has never used smokeless tobacco. He reports current drug use. He reports that he does not drink alcohol.  Physical Exam: BP 113/69   Constitutional:  Alert and oriented, no acute distress, nontoxic appearing HEENT: [name], AT Cardiovascular: No clubbing, cyanosis, or edema Respiratory: Normal respiratory effort, no increased work of breathing Skin: No rashes, bruises or suspicious lesions Neurologic: Grossly intact, no focal deficits,  moving all 4 extremities Psychiatric: Normal mood and affect  Laboratory Data: Results for orders placed or performed in visit on 07/03/23  BLADDER SCAN AMB NON-IMAGING  Result Value Ref Range   Scan Result 130 ml    Assessment & Plan:   1. Urinary retention We discussed today that he is not in retention and a bladder scan of 130 mL 30 minutes after his last void is very normal.  He is nervous he might go into retention over the weekend.  Unfortunately, I am unable to predict if this will happen.  I offered him Foley catheter replacement versus CIC teaching but he declined these.  We discussed that I do not need to refer him for urodynamics at [facility], as he is still an active patient there and all he needs to do is call them to set up an appointment.  I also put this information in his AVS. - BLADDER SCAN AMB NON-IMAGING  Return if symptoms worsen or fail to improve.  Carman Ching, PA-C  [facility] [place] [address] [place], [place] [address] [phone]

## 2023-07-03 NOTE — Patient Instructions (Signed)
Call [facility] and explain to them that you've been having intermittent urinary retention. Your [facility] urology team has recommended urodynamics testing, but you would prefer to have it done at [facility]. They will likely schedule an office visit with you to discuss.

## 2023-07-04 ENCOUNTER — Emergency Department
Admission: EM | Admit: 2023-07-04 | Discharge: 2023-07-04 | Discharge status: Against Medical Advice | Payer: Medicare Other | Attending: Emergency Medicine | Admitting: Emergency Medicine

## 2023-07-04 ENCOUNTER — Other Ambulatory Visit: Payer: Self-pay

## 2023-07-04 DIAGNOSIS — R339 Retention of urine, unspecified: Secondary | ICD-10-CM | POA: Insufficient documentation

## 2023-07-04 DIAGNOSIS — Z5321 Procedure and treatment not carried out due to patient leaving prior to being seen by health care provider: Secondary | ICD-10-CM | POA: Insufficient documentation

## 2023-07-04 LAB — BASIC METABOLIC PANEL
Anion gap: 9 (ref 5–15)
BUN: 18 mg/dL (ref 8–23)
CO2: 23 mmol/L (ref 22–32)
Calcium: 9.1 mg/dL (ref 8.9–10.3)
Chloride: 106 mmol/L (ref 98–111)
Creatinine, Ser: 1.12 mg/dL (ref 0.61–1.24)
GFR, Estimated: >60 mL/min (ref >60)
Glucose, Bld: 125 mg/dL — ABNORMAL HIGH (ref 70–99)
Potassium: 3.9 mmol/L (ref 3.5–5.1)
Sodium: 138 mmol/L (ref 135–145)

## 2023-07-04 LAB — CBC
HCT: 43.4 % (ref 39.0–52.0)
Hemoglobin: 15.3 g/dL (ref 13.0–17.0)
MCH: 30.8 pg (ref 26.0–34.0)
MCHC: 35.3 g/dL (ref 30.0–36.0)
MCV: 87.5 fL (ref 80.0–100.0)
Platelets: 221 10*3/uL (ref 150–400)
RBC: 4.96 MIL/uL (ref 4.22–5.81)
RDW: 12.3 % (ref 11.5–15.5)
WBC: 6.5 10*3/uL (ref 4.0–10.5)
nRBC: 0 % (ref 0.0–0.2)

## 2023-07-04 LAB — URINALYSIS, ROUTINE W REFLEX MICROSCOPIC
Bilirubin Urine: NEGATIVE
Glucose, UA: NEGATIVE mg/dL
Hgb urine dipstick: NEGATIVE
Ketones, ur: NEGATIVE mg/dL
Leukocytes,Ua: NEGATIVE
Nitrite: NEGATIVE
Protein, ur: NEGATIVE mg/dL
Specific Gravity, Urine: 1.009 (ref 1.005–1.030)
pH: 5 (ref 5.0–8.0)

## 2023-07-04 NOTE — ED Triage Notes (Signed)
Pt states that he was seen here a week or 2 ago, per pt, states that he required a foley catheter, states that It was removed and this week he has had to go to the urology office once to have his bladder this week with dr Lonna Cobb

## 2023-07-04 NOTE — ED Notes (Signed)
Pt left bc he got a urology appt for today.

## 2023-07-18 ENCOUNTER — Telehealth: Payer: Self-pay | Admitting: *Deleted

## 2023-07-18 NOTE — Telephone Encounter (Signed)
Recommend keeping [facility] follow-up appointment.  For worsening or symptomatic urinary retention may need ED visit

## 2023-07-18 NOTE — Telephone Encounter (Signed)
Pt calling stating that he needs a catheter placed today, pt states he is not in retention fully, he can urinate but not empty his bladder fully. Pt has appt at [facility] urology Tuesday 07/22/23. He has been going back and forth with his PCP about moving his [facility] urology appt up. Pt was offered CIC and cath by Sam on 07/03/23. Please advise, pt wants to see MD.

## 2024-02-27 ENCOUNTER — Ambulatory Visit: Payer: Self-pay | Admitting: Urology

## 2024-11-15 ENCOUNTER — Inpatient Hospital Stay: Admission: RE | Admit: 2024-11-15

## 2024-11-15 NOTE — Discharge Instructions (Signed)
  Instructions after Knee Arthroscopy    Akeel Reffner P. Erina Hamme, [name], M.D.    Dept. of Orthopaedics & Sports Medicine [facility] [address] [place], [place]  [address]  Phone: [phone]   Fax: [phone]       [email or web address]       DIET: Drink plenty of non-alcoholic fluids & begin a light diet. Resume your normal diet the day after surgery.  ACTIVITY:  You may use crutches or a walker with weight-bearing as tolerated, unless instructed otherwise. You may wean yourself off of the walker or crutches as tolerated.  Begin doing gentle exercises. Exercising will reduce the pain and swelling, increase motion, and prevent muscle weakness.   Avoid strenuous activities or athletics for a minimum of 4-6 weeks after arthroscopic surgery. Do not drive or operate any equipment until instructed.  WOUND CARE:  Place one to two pillows under the knee the first day or two when sitting or lying.  Continue to use the ice packs periodically to reduce pain and swelling. The small incisions in your knee are closed with nylon stitches. The stitches will be removed in the office. The bulky dressing may be removed on the second day after surgery. DO NOT TOUCH THE STITCHES. Put a Band-Aid over each stitch. Do NOT use any ointments or creams on the incisions.  You may bathe or shower after the stitches are removed at the first office visit following surgery.  MEDICATIONS: You may resume your regular medications. Please take the pain medication as prescribed. Do not take pain medication on an empty stomach. Do not drive or drink alcoholic beverages when taking pain medications.  CALL THE OFFICE FOR: Temperature above 101 degrees Excessive bleeding or drainage on the dressing. Excessive swelling, coldness, or paleness of the toes. Persistent nausea and vomiting.  FOLLOW-UP:  You should have an appointment to return to the office in 7-10 days after surgery.       [facility] Department Directory         [email or web address]       [email or web address]          Cardiology  Appointments: [place] - [phone]  Endocrinology  Appointments: [place] [phone] [place] - [phone]  Gastroenterology  Appointments: [place] [phone] [place] - [phone]        General Surgery   Appointments: [facility]  Internal Medicine/Family Medicine  Appointments: [facility] - [phone] [place] - [phone]  Metabolic and Weigh Loss Surgery  Appointments: [facility]        Neurology  Appointments: [place] [phone] [place] - [phone]  Neurosurgery  Appointments: [place]  Obstetrics & Gynecology  Appointments: [place] [phone] [place] - [phone]        Pediatrics  Appointments: [place] [phone] [place] - [phone]  Physiatry  Appointments: [place] [phone]  Physical Therapy  Appointments: [place] - [phone]        Podiatry  Appointments: [place] [phone] [place] - [phone]  Pulmonology  Appointments: [place]  Rheumatology  Appointments: [place] [phone]        [place] Location: [facility]  [address] [place], [place]  [address]  [place] Location: [facility] [address] [place], [place]  [address]  [place] Location: [facility] [address] [place], [place]  [address]

## 2024-11-17 ENCOUNTER — Encounter
Admission: RE | Admit: 2024-11-17 | Discharge: 2024-11-17 | Disposition: A | Discharge status: Home / Self Care | Source: Ambulatory Visit | Attending: Orthopedic Surgery | Admitting: Orthopedic Surgery

## 2024-11-17 ENCOUNTER — Other Ambulatory Visit: Payer: Self-pay

## 2024-11-17 VITALS — Ht 67.0 in | Wt 165.0 lb

## 2024-11-17 DIAGNOSIS — Z01812 Encounter for preprocedural laboratory examination: Secondary | ICD-10-CM

## 2024-11-17 NOTE — Patient Instructions (Addendum)
 Your procedure is scheduled on: Monday 11/22/24 Report to the Registration Desk on the 1st floor of the [facility]. To find out your arrival time, please call [phone] between 1PM - 3PM on: Friday 11/19/24 If your arrival time is 6:00 am, do not arrive before that time as the [facility] entrance doors do not open until 6:00 am.  REMEMBER: Instructions that are not followed completely may result in serious medical risk, up to and including death; or upon the discretion of your surgeon and anesthesiologist your surgery may need to be rescheduled.  Do not eat food after midnight the night before surgery.  No gum chewing or hard candies.  You may however, drink CLEAR liquids up to 2 hours before you are scheduled to arrive for your surgery. Do not drink anything within 2 hours of your scheduled arrival time.  Clear liquids include: - water  - apple juice without pulp - black coffee or tea (Do NOT add milk or creamers to the coffee or tea) Do NOT drink anything that is not on this list.   In addition, your doctor has ordered for you to drink the provided:  Ensure Pre-Surgery Clear Carbohydrate Drink  Drinking this carbohydrate drink up to two hours before surgery helps to reduce insulin resistance and improve patient outcomes. Please complete drinking 2 hours before scheduled arrival time.  One week prior to surgery: Stop Anti-inflammatories (NSAIDS) such as Advil, Aleve, Ibuprofen, Motrin, Naproxen, Naprosyn and Aspirin  based products such as Excedrin, Goody's Powder, BC Powder. Stop ANY OVER THE COUNTER supplements until after surgery. Cholecalciferol  (VITAMIN D -3) 125 MCG (5000 UT)  CRANBERRY PO  cyanocobalamin  (VITAMIN B12) 1000 MCG   You may however, continue to take Tylenol  if needed for pain up until the day of surgery.   Continue taking all of your other prescription medications up until the day of surgery.  ON THE DAY OF SURGERY ONLY TAKE THESE MEDICATIONS WITH SIPS OF  WATER:  atenolol  (TENORMIN ) 50 MG  finasteride  (PROSCAR ) 5 Mg gabapentin  (NEURONTIN ) 100 MG  tamsulosin  (FLOMAX ) 0.4 MG CAPS    No Alcohol  for 24 hours before or after surgery.  No Smoking including e-cigarettes for 24 hours before surgery.  No chewable tobacco products for at least 6 hours before surgery.  No nicotine patches on the day of surgery.  Do not use any recreational drugs for at least a week (preferably 2 weeks) before your surgery.  Please be advised that the combination of cocaine and anesthesia may have negative outcomes, up to and including death. If you test positive for cocaine, your surgery will be cancelled.  On the morning of surgery brush your teeth with toothpaste and water, you may rinse your mouth with mouthwash if you wish. Do not swallow any toothpaste or mouthwash.  Take a fresh shower the morning the before the surgery and the morning of the surgery, suing Dial soap or an antibacterial soap.   Do not wear jewelry, make-up, hairpins, clips or nail polish.  For welded (permanent) jewelry: bracelets, anklets, waist bands, etc.  Please have this removed prior to surgery.  If it is not removed, there is a chance that hospital personnel will need to cut it off on the day of surgery.  Do not wear lotions, powders, or perfumes.   Do not shave body hair from the neck down 48 hours before surgery.  Contact lenses, hearing aids and dentures may not be worn into surgery.  Do not bring valuables to the  hospital. [facility] is not responsible for any missing/lost belongings or valuables.    Notify your doctor if there is any change in your medical condition (cold, fever, infection).  Wear comfortable clothing (specific to your surgery type) to the hospital.  After surgery, you can help prevent lung complications by doing breathing exercises.  Take deep breaths and cough every 1-2 hours. Your doctor may order a device called an Incentive Spirometer to help you  take deep breaths. When coughing or sneezing, hold a pillow firmly against your incision with both hands. This is called splinting. Doing this helps protect your incision. It also decreases belly discomfort.  If you are being admitted to the hospital overnight, leave your suitcase in the car. After surgery it may be brought to your room.  In case of increased patient census, it may be necessary for you, the patient, to continue your postoperative care in the Same Day Surgery department.  If you are being discharged the day of surgery, you will not be allowed to drive home. You will need a responsible individual to drive you home and stay with you for 24 hours after surgery.   If you are taking public transportation, you will need to have a responsible individual with you.  Please call the Pre-admissions Testing Dept. at [phone] if you have any questions about these instructions.  Surgery Visitation Policy:  Patients having surgery or a procedure may have two visitors.  Children under the age of 55 must have an adult with them who is not the patient.  Inpatient Visitation:    Visiting hours are 7 a.m. to 8 p.m. Up to four visitors are allowed at one time in a patient room. The visitors may rotate out with other people during the day.  One visitor age 74 or older may stay with the patient overnight and must be in the room by 8 p.m.   Merchandiser, Retail to address health-related social needs:  [email or web address]

## 2024-11-18 ENCOUNTER — Encounter
Admission: RE | Admit: 2024-11-18 | Discharge: 2024-11-18 | Disposition: A | Discharge status: Home / Self Care | Source: Ambulatory Visit | Attending: Orthopedic Surgery | Admitting: Orthopedic Surgery

## 2024-11-18 DIAGNOSIS — Y831 Surgical operation with implant of artificial internal device as the cause of abnormal reaction of the patient, or of later complication, without mention of misadventure at the time of the procedure: Secondary | ICD-10-CM | POA: Diagnosis not present

## 2024-11-18 DIAGNOSIS — Z01812 Encounter for preprocedural laboratory examination: Secondary | ICD-10-CM | POA: Insufficient documentation

## 2024-11-18 DIAGNOSIS — T8482XA Fibrosis due to internal orthopedic prosthetic devices, implants and grafts, initial encounter: Secondary | ICD-10-CM | POA: Insufficient documentation

## 2024-11-18 LAB — COMPREHENSIVE METABOLIC PANEL WITH GFR
ALT: 23 U/L (ref 0–44)
AST: 24 U/L (ref 15–41)
Albumin: 4.5 g/dL (ref 3.5–5.0)
Alkaline Phosphatase: 73 U/L (ref 38–126)
Anion gap: 12 (ref 5–15)
BUN: 15 mg/dL (ref 8–23)
CO2: 23 mmol/L (ref 22–32)
Calcium: 9.1 mg/dL (ref 8.9–10.3)
Chloride: 105 mmol/L (ref 98–111)
Creatinine, Ser: 1.17 mg/dL (ref 0.61–1.24)
GFR, Estimated: >60 mL/min
Glucose, Bld: 118 mg/dL — ABNORMAL HIGH (ref 70–99)
Potassium: 3.7 mmol/L (ref 3.5–5.1)
Sodium: 140 mmol/L (ref 135–145)
Total Bilirubin: 0.5 mg/dL (ref 0.0–1.2)
Total Protein: 6.8 g/dL (ref 6.5–8.1)

## 2024-11-18 LAB — CBC WITH DIFFERENTIAL/PLATELET
Abs Immature Granulocytes: 0.01 10*3/uL (ref 0.00–0.07)
Basophils Absolute: 0.1 10*3/uL (ref 0.0–0.1)
Basophils Relative: 1 %
Eosinophils Absolute: 0.2 10*3/uL (ref 0.0–0.5)
Eosinophils Relative: 3 %
HCT: 44.2 % (ref 39.0–52.0)
Hemoglobin: 15.2 g/dL (ref 13.0–17.0)
Immature Granulocytes: 0 %
Lymphocytes Relative: 30 %
Lymphs Abs: 1.8 10*3/uL (ref 0.7–4.0)
MCH: 30.2 pg (ref 26.0–34.0)
MCHC: 34.4 g/dL (ref 30.0–36.0)
MCV: 87.7 fL (ref 80.0–100.0)
Monocytes Absolute: 0.6 10*3/uL (ref 0.1–1.0)
Monocytes Relative: 9 %
Neutro Abs: 3.4 10*3/uL (ref 1.7–7.7)
Neutrophils Relative %: 57 %
Platelets: 203 10*3/uL (ref 150–400)
RBC: 5.04 MIL/uL (ref 4.22–5.81)
RDW: 12.5 % (ref 11.5–15.5)
WBC: 6.1 10*3/uL (ref 4.0–10.5)
nRBC: 0 % (ref 0.0–0.2)

## 2024-11-18 LAB — URINALYSIS, ROUTINE W REFLEX MICROSCOPIC
Bilirubin Urine: NEGATIVE
Glucose, UA: NEGATIVE mg/dL
Hgb urine dipstick: NEGATIVE
Ketones, ur: NEGATIVE mg/dL
Leukocytes,Ua: NEGATIVE
Nitrite: NEGATIVE
Protein, ur: NEGATIVE mg/dL
Specific Gravity, Urine: 1.004 — ABNORMAL LOW (ref 1.005–1.030)
pH: 6 (ref 5.0–8.0)

## 2024-11-18 LAB — SURGICAL PCR SCREEN
MRSA, PCR: NEGATIVE
Staphylococcus aureus: NEGATIVE

## 2024-11-18 LAB — SEDIMENTATION RATE: Sed Rate: 3 mm/h (ref 0–20)

## 2024-11-18 LAB — C-REACTIVE PROTEIN: CRP: <0.5 mg/dL

## 2024-11-21 ENCOUNTER — Encounter: Payer: Self-pay | Admitting: Orthopedic Surgery

## 2024-11-21 MED ORDER — CELECOXIB 200 MG PO CAPS
400.0000 mg | ORAL_CAPSULE | Freq: Once | ORAL | Status: AC
  Administered 2024-11-22: 400 mg via ORAL

## 2024-11-21 MED ORDER — LACTATED RINGERS IV SOLN: INTRAVENOUS | Status: DC

## 2024-11-21 MED ORDER — CEFAZOLIN SODIUM-DEXTROSE 2-4 GM/100ML-% IV SOLN
2.0000 g | INTRAVENOUS | Status: AC
  Administered 2024-11-22: 2 g via INTRAVENOUS

## 2024-11-22 ENCOUNTER — Other Ambulatory Visit: Payer: Self-pay

## 2024-11-22 ENCOUNTER — Ambulatory Visit: Admission: RE | Admit: 2024-11-22 | Admitting: Orthopedic Surgery

## 2024-11-22 ENCOUNTER — Encounter: Payer: Self-pay | Admitting: Orthopedic Surgery

## 2024-11-22 ENCOUNTER — Ambulatory Visit: Admitting: Certified Registered Nurse Anesthetist

## 2024-11-22 ENCOUNTER — Ambulatory Visit: Payer: Self-pay | Admitting: Urgent Care

## 2024-11-22 ENCOUNTER — Encounter: Admission: RE | Payer: Self-pay | Source: Home / Self Care

## 2024-11-22 DIAGNOSIS — Z96652 Presence of left artificial knee joint: Secondary | ICD-10-CM | POA: Insufficient documentation

## 2024-11-22 DIAGNOSIS — T8482XD Fibrosis due to internal orthopedic prosthetic devices, implants and grafts, subsequent encounter: Secondary | ICD-10-CM | POA: Insufficient documentation

## 2024-11-22 DIAGNOSIS — T8482XA Fibrosis due to internal orthopedic prosthetic devices, implants and grafts, initial encounter: Secondary | ICD-10-CM

## 2024-11-22 DIAGNOSIS — Y792 Prosthetic and other implants, materials and accessory orthopedic devices associated with adverse incidents: Secondary | ICD-10-CM | POA: Insufficient documentation

## 2024-11-22 DIAGNOSIS — I10 Essential (primary) hypertension: Secondary | ICD-10-CM | POA: Insufficient documentation

## 2024-11-22 DIAGNOSIS — K219 Gastro-esophageal reflux disease without esophagitis: Secondary | ICD-10-CM | POA: Insufficient documentation

## 2024-11-22 DIAGNOSIS — M24662 Ankylosis, left knee: Secondary | ICD-10-CM | POA: Insufficient documentation

## 2024-11-22 DIAGNOSIS — Z87891 Personal history of nicotine dependence: Secondary | ICD-10-CM | POA: Insufficient documentation

## 2024-11-22 DIAGNOSIS — Z9889 Other specified postprocedural states: Secondary | ICD-10-CM

## 2024-11-22 MED ORDER — ONDANSETRON HCL 4 MG/2ML IJ SOLN: 4.0000 mg | Freq: Four times a day (QID) | INTRAMUSCULAR | Status: DC | PRN

## 2024-11-22 MED ORDER — METOCLOPRAMIDE HCL 5 MG/ML IJ SOLN: 5.0000 mg | Freq: Three times a day (TID) | INTRAMUSCULAR | Status: DC | PRN

## 2024-11-22 MED ORDER — EPHEDRINE SULFATE-NACL 50-0.9 MG/10ML-% IV SOSY
PREFILLED_SYRINGE | INTRAVENOUS | Status: DC | PRN
  Administered 2024-11-22 (×3): 5 mg via INTRAVENOUS
  Administered 2024-11-22 (×2): 10 mg via INTRAVENOUS
  Administered 2024-11-22: 5 mg via INTRAVENOUS
  Administered 2024-11-22: 10 mg via INTRAVENOUS

## 2024-11-22 MED ORDER — LIDOCAINE HCL (CARDIAC) PF 100 MG/5ML IV SOSY
PREFILLED_SYRINGE | INTRAVENOUS | Status: DC | PRN
  Administered 2024-11-22: 100 mg via INTRAVENOUS

## 2024-11-22 MED ORDER — CELECOXIB 200 MG PO CAPS
ORAL_CAPSULE | ORAL | Status: AC
  Filled 2024-11-22: qty 1

## 2024-11-22 MED ORDER — VASOPRESSIN 20 UNIT/ML IV SOLN
INTRAVENOUS | Status: DC | PRN
  Administered 2024-11-22 (×5): 1 [IU] via INTRAVENOUS

## 2024-11-22 MED ORDER — ACETAMINOPHEN 10 MG/ML IV SOLN
INTRAVENOUS | Status: DC | PRN
  Administered 2024-11-22: 1000 mg via INTRAVENOUS

## 2024-11-22 MED ORDER — RINGERS IRRIGATION IR SOLN
Status: DC | PRN
  Administered 2024-11-22 (×6): 1

## 2024-11-22 MED ORDER — MORPHINE SULFATE (PF) 2 MG/ML IV SOLN: 0.5000 mg | INTRAVENOUS | Status: DC | PRN

## 2024-11-22 MED ORDER — PROPOFOL 10 MG/ML IV BOLUS
INTRAVENOUS | Status: DC | PRN
  Administered 2024-11-22: 150 mg via INTRAVENOUS

## 2024-11-22 MED ORDER — FENTANYL CITRATE (PF) 100 MCG/2ML IJ SOLN
INTRAMUSCULAR | Status: AC
  Filled 2024-11-22: qty 2

## 2024-11-22 MED ORDER — LIDOCAINE HCL (PF) 2 % IJ SOLN
INTRAMUSCULAR | Status: AC
  Filled 2024-11-22: qty 5

## 2024-11-22 MED ORDER — ONDANSETRON HCL 4 MG/2ML IJ SOLN
INTRAMUSCULAR | Status: DC | PRN
  Administered 2024-11-22: 4 mg via INTRAVENOUS

## 2024-11-22 MED ORDER — ONDANSETRON HCL 4 MG/2ML IJ SOLN
INTRAMUSCULAR | Status: AC
  Filled 2024-11-22: qty 2

## 2024-11-22 MED ORDER — BUPIVACAINE-EPINEPHRINE 0.25% -1:200000 IJ SOLN
INTRAMUSCULAR | Status: DC | PRN
  Administered 2024-11-22: 5 mL
  Administered 2024-11-22: 25 mL

## 2024-11-22 MED ORDER — CEFAZOLIN SODIUM-DEXTROSE 2-4 GM/100ML-% IV SOLN
INTRAVENOUS | Status: AC
  Filled 2024-11-22: qty 100

## 2024-11-22 MED ORDER — EPHEDRINE 5 MG/ML INJ
INTRAVENOUS | Status: AC
  Filled 2024-11-22: qty 5

## 2024-11-22 MED ORDER — PHENYLEPHRINE 80 MCG/ML (10ML) SYRINGE FOR IV PUSH (FOR BLOOD PRESSURE SUPPORT)
PREFILLED_SYRINGE | INTRAVENOUS | Status: DC | PRN
  Administered 2024-11-22 (×7): 80 ug via INTRAVENOUS

## 2024-11-22 MED ORDER — ONDANSETRON HCL 4 MG PO TABS: 4.0000 mg | ORAL_TABLET | Freq: Four times a day (QID) | ORAL | Status: DC | PRN

## 2024-11-22 MED ORDER — DEXAMETHASONE SOD PHOSPHATE PF 10 MG/ML IJ SOLN
INTRAMUSCULAR | Status: DC | PRN
  Administered 2024-11-22: 10 mg via INTRAVENOUS

## 2024-11-22 MED ORDER — FENTANYL CITRATE (PF) 100 MCG/2ML IJ SOLN
INTRAMUSCULAR | Status: DC | PRN
  Administered 2024-11-22 (×4): 25 ug via INTRAVENOUS

## 2024-11-22 MED ORDER — PHENYLEPHRINE HCL-NACL 20-0.9 MG/250ML-% IV SOLN
INTRAVENOUS | Status: AC
  Filled 2024-11-22: qty 250

## 2024-11-22 MED ORDER — LACTATED RINGERS IV SOLN: INTRAVENOUS | Status: DC

## 2024-11-22 MED ORDER — DEXAMETHASONE SOD PHOSPHATE PF 10 MG/ML IJ SOLN
INTRAMUSCULAR | Status: AC
  Filled 2024-11-22: qty 1

## 2024-11-22 MED ORDER — SODIUM CHLORIDE 0.9 % IV SOLN: INTRAVENOUS | Status: DC

## 2024-11-22 MED ORDER — PHENYLEPHRINE HCL-NACL 20-0.9 MG/250ML-% IV SOLN
INTRAVENOUS | Status: DC | PRN
  Administered 2024-11-22: 50 ug/min via INTRAVENOUS

## 2024-11-22 MED ORDER — PHENYLEPHRINE 80 MCG/ML (10ML) SYRINGE FOR IV PUSH (FOR BLOOD PRESSURE SUPPORT)
PREFILLED_SYRINGE | INTRAVENOUS | Status: AC
  Filled 2024-11-22: qty 10

## 2024-11-22 MED ORDER — HYDROCODONE-ACETAMINOPHEN 5-325 MG PO TABS
1.0000 | ORAL_TABLET | Freq: Four times a day (QID) | ORAL | 0 refills | Status: AC | PRN
  Filled 2024-11-22: qty 30, 4d supply, fill #0

## 2024-11-22 MED ORDER — MIDAZOLAM HCL 2 MG/2ML IJ SOLN
INTRAMUSCULAR | Status: AC
  Filled 2024-11-22: qty 2

## 2024-11-22 MED ORDER — ACETAMINOPHEN 10 MG/ML IV SOLN
INTRAVENOUS | Status: AC
  Filled 2024-11-22: qty 100

## 2024-11-22 MED ORDER — GLYCOPYRROLATE 0.2 MG/ML IJ SOLN
INTRAMUSCULAR | Status: DC | PRN
  Administered 2024-11-22: .2 mg via INTRAVENOUS

## 2024-11-22 MED ORDER — MORPHINE SULFATE 4 MG/ML IJ SOLN
INTRAMUSCULAR | Status: DC | PRN
  Administered 2024-11-22: 4 mg via INTRAVENOUS

## 2024-11-22 MED ORDER — HYDROCODONE-ACETAMINOPHEN 7.5-325 MG PO TABS: 1.0000 | ORAL_TABLET | ORAL | Status: DC | PRN

## 2024-11-22 MED ORDER — METOCLOPRAMIDE HCL 10 MG PO TABS: 5.0000 mg | ORAL_TABLET | Freq: Three times a day (TID) | ORAL | Status: DC | PRN

## 2024-11-22 MED ORDER — ACETAMINOPHEN 325 MG PO TABS: 325.0000 mg | ORAL_TABLET | Freq: Four times a day (QID) | ORAL | Status: DC | PRN

## 2024-11-22 MED ORDER — HYDROCODONE-ACETAMINOPHEN 5-325 MG PO TABS: 1.0000 | ORAL_TABLET | ORAL | Status: DC | PRN

## 2024-11-22 NOTE — Interval H&P Note (Signed)
 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  The various methods of treatment have been discussed with the patient and family. After consideration of risks, benefits and other options for treatment, the patient has consented to  Procedures with comments: ARTHROSCOPY, KNEE (Left) - WITH LYSIS OF ADHESIONS as a surgical intervention.  The patient's history has been reviewed, patient examined, no change in status, stable for surgery.  I have reviewed the patient's chart and labs.  Questions were answered to the patient's satisfaction.     Sophira Rumler P Crystalyn Delia

## 2024-11-22 NOTE — Transfer of Care (Signed)
 Immediate Anesthesia Transfer of Care Note  Patient: Ernest Hutchinson  Procedure(s) Performed: ARTHROSCOPY, KNEE (Left: Knee)  Patient Location: PACU  Anesthesia Type:General  Level of Consciousness: awake and alert   Airway & Oxygen Therapy: Patient Spontanous Breathing and Patient connected to face mask oxygen  Post-op Assessment: Report given to RN and Post -op Vital signs reviewed and stable  Post vital signs: Reviewed and stable  Last Vitals:  Vitals Value Taken Time  BP 98/57 11/22/24 14:22  Temp    Pulse 84 11/22/24 14:28  Resp 11 11/22/24 14:28  SpO2 99 % 11/22/24 14:28  Vitals shown include unfiled device data.  Last Pain:  Vitals:   11/22/24 0938  TempSrc: Temporal  PainSc: 0-No pain         Complications: No notable events documented.

## 2024-11-22 NOTE — Op Note (Signed)
 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   ASSISTANT: none  ANESTHESIA: general  ESTIMATED BLOOD LOSS: Minimal  FLUIDS REPLACED: 800 mL of crystalloid  TOURNIQUET TIME: Used  INDICATIONS FOR SURGERY: Ernest Hutchinson is a 74 y.o. year old male who previously underwent left total knee revision arthroplasty for component loosening.  He done well initially but had noted progressive decrease with his knee range of motion.  Findings consistent with arthrofibrosis of the left knee.  After discussion of the risks and benefits of surgical intervention, the patient expressed understanding of the risks benefits and agree with plans for left knee arthroscopy and lysis of adhesions.   PROCEDURE IN DETAIL: The patient was brought into the operating room and, after adequate general anesthesia was achieved, a tourniquet was applied to the left thigh and the leg was placed in the leg holder. All bony prominences were well padded. The patient's left knee was cleaned and prepped with alcohol  and Duraprep and draped in the usual sterile fashion. A timeout was performed as per usual protocol. The anticipated portal sites were injected with 0.25% Marcaine  with epinephrine . An anterolateral incision was made and a cannula was inserted. The knee was distended with fluid using the pump.  Dense fibrotic tissue was encountered in the anterior compartment, totally obscuring the components.   Under visualization with the scope, an anteromedial portal was created and an ArthroCare wand was inserted.  The wand was used to debride the fibrotic tissue so as to visualize the polyethylene implant and the  central box.  The central box was completely filled with the dense fibrotic tissue.  Debridement was continued medially and then up the medial gutter using a combination of the ArthroCare wand and the incisor shaver.  The medial gutter was reestablished.  The knee was fully extended and systematic debridement of the fibrotic tissue was performed around the patellar component so as to completely visualize the patella.  The suprapatellar pouch was also debrided with reestablishment of normal suprapatellar pouch.    The scope was removed from the lateral portal and reinserted via the anteromedial portal to better visualize the lateral compartment.  Again, dense fibrotic tissue was encountered.  This was debrided using the ArthroCare wand and the incisor shaver extending along the anterolateral aspect of the implant and then along the lateral gutter.  The lateral gutter was reestablished with complete lysis of adhesions.  The scope was advanced so as to visualize the patellofemoral articulation. Good patellar tracking was appreciated.   The knee was irrigated with copius amounts of fluid and suctioned dry. The anterolateral portal was re-approximated with #3-0 nylon. A combination of 0.25% Marcaine  with epinephrine  and 4 mg of Morphine  were injected via the scope. The scope was removed and the anteromedial portal was re-approximated with #3-0 nylon. A sterile dressing was applied followed by application of an ice wrap.  The patient tolerated the procedure well and was transported to the PACU in stable condition.  [name], [name], M.D.

## 2024-11-23 ENCOUNTER — Encounter: Payer: Self-pay | Admitting: Orthopedic Surgery
